# Patient Record
Sex: Female | Born: 1950 | Race: White | Hispanic: No | Smoking: Former smoker
Health system: Southern US, Community
[De-identification: ages and names within clinical notes are randomized; demographics above are authoritative.]

## PROBLEM LIST (undated history)

## (undated) DIAGNOSIS — D649 Anemia, unspecified: Secondary | ICD-10-CM

## (undated) DIAGNOSIS — E785 Hyperlipidemia, unspecified: Secondary | ICD-10-CM

## (undated) DIAGNOSIS — E079 Disorder of thyroid, unspecified: Secondary | ICD-10-CM

## (undated) DIAGNOSIS — R42 Dizziness and giddiness: Secondary | ICD-10-CM

## (undated) DIAGNOSIS — D62 Acute posthemorrhagic anemia: Secondary | ICD-10-CM

## (undated) DIAGNOSIS — Z972 Presence of dental prosthetic device (complete) (partial): Secondary | ICD-10-CM

## (undated) DIAGNOSIS — Z8719 Personal history of other diseases of the digestive system: Secondary | ICD-10-CM

## (undated) DIAGNOSIS — Z8711 Personal history of peptic ulcer disease: Secondary | ICD-10-CM

## (undated) HISTORY — DX: Anemia, unspecified: D64.9

## (undated) HISTORY — DX: Acute posthemorrhagic anemia: D62

## (undated) HISTORY — DX: Disorder of thyroid, unspecified: E07.9

## (undated) HISTORY — DX: Personal history of peptic ulcer disease: Z87.11

## (undated) HISTORY — DX: Hyperlipidemia, unspecified: E78.5

## (undated) HISTORY — DX: Personal history of other diseases of the digestive system: Z87.19

---

## 1981-09-11 HISTORY — PX: STOMACH SURGERY: SHX791

## 1988-09-11 HISTORY — PX: KNEE SURGERY: SHX244

## 2011-09-26 ENCOUNTER — Ambulatory Visit: Payer: Self-pay

## 2012-09-26 ENCOUNTER — Ambulatory Visit: Payer: Self-pay

## 2012-12-16 ENCOUNTER — Ambulatory Visit: Payer: Self-pay | Admitting: Gastroenterology

## 2013-09-30 ENCOUNTER — Ambulatory Visit: Payer: Self-pay

## 2013-10-23 ENCOUNTER — Ambulatory Visit: Payer: Self-pay

## 2013-12-23 ENCOUNTER — Encounter: Payer: Self-pay | Admitting: General Surgery

## 2014-01-15 ENCOUNTER — Ambulatory Visit: Payer: Self-pay | Admitting: General Surgery

## 2014-01-21 ENCOUNTER — Other Ambulatory Visit: Payer: Self-pay | Admitting: General Surgery

## 2014-01-21 ENCOUNTER — Ambulatory Visit (INDEPENDENT_AMBULATORY_CARE_PROVIDER_SITE_OTHER): Payer: 59 | Admitting: General Surgery

## 2014-01-21 ENCOUNTER — Encounter: Payer: Self-pay | Admitting: General Surgery

## 2014-01-21 VITALS — BP 140/80 | HR 68 | Resp 12 | Ht 66.0 in | Wt 163.0 lb

## 2014-01-21 DIAGNOSIS — R221 Localized swelling, mass and lump, neck: Secondary | ICD-10-CM

## 2014-01-21 DIAGNOSIS — D234 Other benign neoplasm of skin of scalp and neck: Secondary | ICD-10-CM

## 2014-01-21 DIAGNOSIS — R220 Localized swelling, mass and lump, head: Secondary | ICD-10-CM

## 2014-01-21 NOTE — Patient Instructions (Signed)
Patient to return in 1 week for nurse visit. The patient is aware to call back for any questions or concerns.  

## 2014-01-21 NOTE — Progress Notes (Signed)
Patient ID: Diane Weiss, female   DOB: September 12, 1950, 63 y.o.   MRN: [id number]  Chief Complaint  Patient presents with  . Mass    back of head    HPI Diane Weiss is a 63 y.o. female.  Here today for evaluation of a knot on the back of her head. The patient states the knot has been there for several years approximately 30 years. She states the area has gotten larger gradually over time. The only time it seems to bother her is when she lays down on that side of her head. No injuries in this area.   HPI  Past Medical History  Diagnosis Date  . Thyroid disease   . Hyperlipidemia   . History of stomach ulcers     Past Surgical History  Procedure Laterality Date  . Stomach surgery  1983    bleeding ulcers  . Knee surgery  1990    Family History  Problem Relation Age of Onset  . Cancer Cousin   . Hypertension Mother   . Hypertension Father   . Diabetes Mother     Social History History  Substance Use Topics  . Smoking status: Former Smoker    Quit date: 09/11/1981  . Smokeless tobacco: Not on file  . Alcohol Use: No    No Known Allergies  Current Outpatient Prescriptions  Medication Sig Dispense Refill  . baclofen (LIORESAL) 10 MG tablet Take 10 mg by mouth daily.      . calcium carbonate (OS-CAL) 600 MG TABS tablet Take 600 mg by mouth 2 (two) times daily with a meal.      . cholecalciferol (VITAMIN D) 1000 UNITS tablet Take 1,000 Units by mouth 2 (two) times daily.      Marland Kitchen ezetimibe (ZETIA) 10 MG tablet Take 10 mg by mouth daily.      Marland Kitchen ibandronate (BONIVA) 150 MG tablet Take 150 mg by mouth every 30 (thirty) days. Take in the morning with a full glass of water, on an empty stomach, and do not take anything else by mouth or lie down for the next 30 min.      Marland Kitchen levothyroxine (SYNTHROID, LEVOTHROID) 75 MCG tablet Take 75 mcg by mouth daily before breakfast.      . lovastatin (ALTOPREV) 40 MG 24 hr tablet Take 40 mg by mouth at bedtime.      . Misc Natural Products  (LUTEIN 20 PO) Take by mouth daily.      . traMADol (ULTRAM) 50 MG tablet Take by mouth every 6 (six) hours as needed.       No current facility-administered medications for this visit.    Review of Systems Review of Systems  Constitutional: Negative.   Respiratory: Negative.   Cardiovascular: Negative.     Blood pressure 140/80, pulse 68, resp. rate 12, height 5\' 6"  (1.676 m), weight 163 lb (73.936 kg).  Physical Exam Physical Exam  Constitutional: She is oriented to person, place, and time. She appears well-developed and well-nourished.  Neck: Neck supple. No thyromegaly present.  Cardiovascular: Normal rate, regular rhythm and normal heart sounds.   No murmur heard. Pulmonary/Chest: Effort normal and breath sounds normal.  Lymphadenopathy:    She has no cervical adenopathy.  Neurological: She is alert and oriented to person, place, and time.  Skin: Skin is warm and dry.  Right parietal scalp there is a 3 x 4 cm soft mobile mass.        Assessment  Enlarging dermal cyst of the scalp.    Plan    It was elected to proceed to excision. 10 cc of 0.5% Xylocaine with 0.25% Marcaine with 1 200,000 units of epinephrine was utilized for local anesthesia well tolerated.  An ellipse of skin was excised to prevent redundant tissue and the cyst was removed. A significant liquid component did rupture during excision, but the entire cyst wall was removed. The wound was closed in layers with 3-0 Vicryl to obliterate the dead space and a running 4-0 nylon suture for the skin. Bacitracin was applied to the skin. The procedure was well tolerated.    The patient return in one week for suture removal. PCP: Dear, Lorayne Bender 01/21/2014, 2:33 PM

## 2014-01-22 DIAGNOSIS — R220 Localized swelling, mass and lump, head: Secondary | ICD-10-CM | POA: Insufficient documentation

## 2014-01-22 DIAGNOSIS — D234 Other benign neoplasm of skin of scalp and neck: Secondary | ICD-10-CM | POA: Insufficient documentation

## 2014-01-23 LAB — PATHOLOGY

## 2014-01-27 ENCOUNTER — Telehealth: Payer: Self-pay | Admitting: *Deleted

## 2014-01-27 NOTE — Telephone Encounter (Signed)
Pt was called to confirm appointment and also was given results

## 2014-01-27 NOTE — Telephone Encounter (Signed)
Message copied by Carson Myrtle on Tue Jan 27, 2014  8:23 AM ------      Message from: [place], [place] W      Created: Mon Jan 26, 2014 11:52 AM       The patient is coming in on Thursday for suture removal. Please notify her the path was benign. Thanks. She should call if the area recurs.       ----- Message -----         From: [facility] Lab Results In Interface         Sent: 01/26/2014  10:05 AM           To: Robert Bellow, MD                   ------

## 2014-01-28 NOTE — Telephone Encounter (Signed)
Notified patient as instructed, patient pleased. Discussed follow-up appointments, patient agrees  

## 2014-01-29 ENCOUNTER — Ambulatory Visit (INDEPENDENT_AMBULATORY_CARE_PROVIDER_SITE_OTHER): Payer: Self-pay | Admitting: *Deleted

## 2014-01-29 DIAGNOSIS — R221 Localized swelling, mass and lump, neck: Secondary | ICD-10-CM

## 2014-01-29 DIAGNOSIS — R220 Localized swelling, mass and lump, head: Secondary | ICD-10-CM

## 2014-01-29 NOTE — Progress Notes (Signed)
Patient came in today for a wound check.  The wound is clean, with no signs of infection noted.The sutures were removed.  

## 2014-07-13 ENCOUNTER — Encounter: Payer: Self-pay | Admitting: General Surgery

## 2015-10-18 ENCOUNTER — Other Ambulatory Visit: Payer: Self-pay | Admitting: Obstetrics and Gynecology

## 2015-10-18 DIAGNOSIS — Z01419 Encounter for gynecological examination (general) (routine) without abnormal findings: Secondary | ICD-10-CM | POA: Diagnosis not present

## 2015-10-18 DIAGNOSIS — Z315 Encounter for genetic counseling: Secondary | ICD-10-CM | POA: Diagnosis not present

## 2015-10-18 DIAGNOSIS — R7309 Other abnormal glucose: Secondary | ICD-10-CM | POA: Diagnosis not present

## 2015-10-18 DIAGNOSIS — E785 Hyperlipidemia, unspecified: Secondary | ICD-10-CM | POA: Diagnosis not present

## 2015-10-18 DIAGNOSIS — E039 Hypothyroidism, unspecified: Secondary | ICD-10-CM | POA: Diagnosis not present

## 2015-10-18 DIAGNOSIS — Z13820 Encounter for screening for osteoporosis: Secondary | ICD-10-CM

## 2015-10-18 DIAGNOSIS — Z1231 Encounter for screening mammogram for malignant neoplasm of breast: Secondary | ICD-10-CM

## 2015-10-18 DIAGNOSIS — Z1239 Encounter for other screening for malignant neoplasm of breast: Secondary | ICD-10-CM | POA: Diagnosis not present

## 2015-10-18 DIAGNOSIS — Z0000 Encounter for general adult medical examination without abnormal findings: Secondary | ICD-10-CM | POA: Diagnosis not present

## 2015-10-18 DIAGNOSIS — Z803 Family history of malignant neoplasm of breast: Secondary | ICD-10-CM | POA: Diagnosis not present

## 2015-10-19 ENCOUNTER — Inpatient Hospital Stay
Admission: RE | Admit: 2015-10-19 | Discharge: 2015-10-19 | Disposition: A | Discharge status: Home / Self Care | Payer: Self-pay | Source: Ambulatory Visit | Attending: *Deleted | Admitting: *Deleted

## 2015-10-19 ENCOUNTER — Other Ambulatory Visit: Payer: Self-pay | Admitting: *Deleted

## 2015-10-19 ENCOUNTER — Other Ambulatory Visit: Payer: Self-pay | Admitting: Obstetrics and Gynecology

## 2015-10-19 DIAGNOSIS — R928 Other abnormal and inconclusive findings on diagnostic imaging of breast: Secondary | ICD-10-CM

## 2015-10-19 DIAGNOSIS — Z9289 Personal history of other medical treatment: Secondary | ICD-10-CM

## 2015-10-19 DIAGNOSIS — Z803 Family history of malignant neoplasm of breast: Secondary | ICD-10-CM | POA: Diagnosis not present

## 2015-10-26 DIAGNOSIS — G894 Chronic pain syndrome: Secondary | ICD-10-CM | POA: Diagnosis not present

## 2015-10-26 DIAGNOSIS — M791 Myalgia: Secondary | ICD-10-CM | POA: Diagnosis not present

## 2015-10-28 ENCOUNTER — Ambulatory Visit
Admission: RE | Admit: 2015-10-28 | Discharge: 2015-10-28 | Disposition: A | Discharge status: Home / Self Care | Payer: PPO | Source: Ambulatory Visit | Attending: Obstetrics and Gynecology | Admitting: Obstetrics and Gynecology

## 2015-10-28 DIAGNOSIS — Z780 Asymptomatic menopausal state: Secondary | ICD-10-CM | POA: Diagnosis not present

## 2015-10-28 DIAGNOSIS — M810 Age-related osteoporosis without current pathological fracture: Secondary | ICD-10-CM | POA: Diagnosis not present

## 2015-10-28 DIAGNOSIS — Z13820 Encounter for screening for osteoporosis: Secondary | ICD-10-CM | POA: Diagnosis not present

## 2015-11-02 ENCOUNTER — Other Ambulatory Visit: Payer: Self-pay | Admitting: Obstetrics and Gynecology

## 2015-11-02 ENCOUNTER — Ambulatory Visit
Admission: RE | Admit: 2015-11-02 | Discharge: 2015-11-02 | Disposition: A | Discharge status: Home / Self Care | Payer: PPO | Source: Ambulatory Visit | Attending: Obstetrics and Gynecology | Admitting: Obstetrics and Gynecology

## 2015-11-02 DIAGNOSIS — R928 Other abnormal and inconclusive findings on diagnostic imaging of breast: Secondary | ICD-10-CM | POA: Diagnosis not present

## 2015-11-02 DIAGNOSIS — N6489 Other specified disorders of breast: Secondary | ICD-10-CM | POA: Diagnosis not present

## 2015-11-16 DIAGNOSIS — R7309 Other abnormal glucose: Secondary | ICD-10-CM | POA: Diagnosis not present

## 2015-11-16 DIAGNOSIS — E039 Hypothyroidism, unspecified: Secondary | ICD-10-CM | POA: Diagnosis not present

## 2015-11-16 DIAGNOSIS — E875 Hyperkalemia: Secondary | ICD-10-CM | POA: Diagnosis not present

## 2015-11-16 DIAGNOSIS — E785 Hyperlipidemia, unspecified: Secondary | ICD-10-CM | POA: Diagnosis not present

## 2016-01-24 DIAGNOSIS — M791 Myalgia: Secondary | ICD-10-CM | POA: Diagnosis not present

## 2016-01-24 DIAGNOSIS — Z79891 Long term (current) use of opiate analgesic: Secondary | ICD-10-CM | POA: Diagnosis not present

## 2016-01-24 DIAGNOSIS — Z79899 Other long term (current) drug therapy: Secondary | ICD-10-CM | POA: Diagnosis not present

## 2016-01-24 DIAGNOSIS — G894 Chronic pain syndrome: Secondary | ICD-10-CM | POA: Diagnosis not present

## 2016-02-25 DIAGNOSIS — G894 Chronic pain syndrome: Secondary | ICD-10-CM | POA: Diagnosis not present

## 2016-02-25 DIAGNOSIS — M791 Myalgia: Secondary | ICD-10-CM | POA: Diagnosis not present

## 2016-03-25 ENCOUNTER — Emergency Department (HOSPITAL_COMMUNITY): Payer: PPO

## 2016-03-25 ENCOUNTER — Emergency Department (HOSPITAL_COMMUNITY)
Admission: EM | Admit: 2016-03-25 | Discharge: 2016-03-25 | Disposition: A | Discharge status: Home / Self Care | Payer: PPO | Attending: Emergency Medicine | Admitting: Emergency Medicine

## 2016-03-25 ENCOUNTER — Encounter (HOSPITAL_COMMUNITY): Payer: Self-pay

## 2016-03-25 DIAGNOSIS — S52591A Other fractures of lower end of right radius, initial encounter for closed fracture: Secondary | ICD-10-CM | POA: Insufficient documentation

## 2016-03-25 DIAGNOSIS — Y9367 Activity, basketball: Secondary | ICD-10-CM | POA: Diagnosis not present

## 2016-03-25 DIAGNOSIS — Y999 Unspecified external cause status: Secondary | ICD-10-CM | POA: Diagnosis not present

## 2016-03-25 DIAGNOSIS — E785 Hyperlipidemia, unspecified: Secondary | ICD-10-CM | POA: Insufficient documentation

## 2016-03-25 DIAGNOSIS — S52501A Unspecified fracture of the lower end of right radius, initial encounter for closed fracture: Secondary | ICD-10-CM

## 2016-03-25 DIAGNOSIS — Z87891 Personal history of nicotine dependence: Secondary | ICD-10-CM | POA: Insufficient documentation

## 2016-03-25 DIAGNOSIS — S6991XA Unspecified injury of right wrist, hand and finger(s), initial encounter: Secondary | ICD-10-CM | POA: Diagnosis not present

## 2016-03-25 DIAGNOSIS — Y929 Unspecified place or not applicable: Secondary | ICD-10-CM | POA: Insufficient documentation

## 2016-03-25 DIAGNOSIS — S5291XA Unspecified fracture of right forearm, initial encounter for closed fracture: Secondary | ICD-10-CM | POA: Diagnosis not present

## 2016-03-25 DIAGNOSIS — W010XXA Fall on same level from slipping, tripping and stumbling without subsequent striking against object, initial encounter: Secondary | ICD-10-CM | POA: Diagnosis not present

## 2016-03-25 MED ORDER — OXYCODONE-ACETAMINOPHEN 5-325 MG PO TABS: 1.0000 | ORAL_TABLET | ORAL | Status: DC | PRN

## 2016-03-25 MED ORDER — NAPROXEN 500 MG PO TABS: 500.0000 mg | ORAL_TABLET | Freq: Two times a day (BID) | ORAL | Status: DC

## 2016-03-25 MED ORDER — OXYCODONE-ACETAMINOPHEN 5-325 MG PO TABS
1.0000 | ORAL_TABLET | Freq: Once | ORAL | Status: AC
  Administered 2016-03-25: 1 via ORAL
  Filled 2016-03-25: qty 1

## 2016-03-25 NOTE — Discharge Instructions (Signed)
Radial Fracture  A radial fracture is a break in the radius bone, which is the long bone of the forearm that is on the same side as your thumb. Your forearm is the part of your arm that is between your elbow and your wrist. It is made up of two bones: the radius and the ulna.  Most radial fractures occur near the wrist (distal radialfracture) or near the elbow (radial head fracture). A distal radial fracture is the most common type of broken arm. This fracture usually occurs about an inch above the wrist. Fractures of the middle part of the bone are less common.  CAUSES   Falling with your arm outstretched is the most common cause of a radial fracture. Other causes include:   Car accidents.   Bike accidents.   A direct blow to the middle part of the radius.  RISK FACTORS   You may be at greater risk for a distal radial fracture if you are 60 years of age or older.   You may be at greater risk for a radial head fracture if you are:    Female.    30-40 years old.   You may be at a greater risk for all types of radial fractures if you have a condition that causes your bones to be weak or thin (osteoporosis).  SIGNS AND SYMPTOMS  A radial fracture causes pain immediately after the injury. Other signs and symptoms include:   An abnormal bend or bump in your arm (deformity).   Swelling.   Bruising.   Numbness or tingling.   Tenderness.   Limited movement.  DIAGNOSIS   Your health care provider may diagnose a radial fracture based on:   Your symptoms.   Your medical history, including any recent injury.   A physical exam. Your health care provider will look for any deformity and feel for tenderness over the break. Your health care provider will also check whether the bone is out of place.   An X-ray exam to confirm the diagnosis and learn more about the type of fracture.  TREATMENT  The goals of treatment are to get the bone in proper position for healing and to keep it from moving so it will heal over  time. Your treatment will depend on many factors, especially the type of fracture that you have.   If the fractured bone:    Is in the correct position (nondisplaced), you may only need to wear a cast or a splint.    Has a slightly displaced fracture, you may need to have the bones moved back into place manually (closed reduction) before the splint or cast is put on.   You may have a temporary splint before you have a plaster cast. The splint allows room for some swelling. After a few days, a cast can replace the splint.    You may have to wear the cast for about 6 weeks or as directed by your health care provider.    The cast may be changed after about 3 weeks or as directed by your health care provider.   After your cast is taken off, you may need physical therapy to regain full movement in your wrist or elbow.   You may need emergency surgery if you have:    A fractured bone that is out of position (displaced).    A fracture with multiple fragments (comminuted fracture).    A fracture that breaks the skin (open fracture). This type of   fracture may require surgical wires, plates, or screws to hold the bone in place.   You may have X-rays every couple of weeks to check on your healing.  HOME CARE INSTRUCTIONS   Keep the injured arm above the level of your heart while you are sitting or lying down. This helps to reduce swelling and pain.   Apply ice to the injured area:    Put ice in a plastic bag.    Place a towel between your skin and the bag.    Leave the ice on for 20 minutes, 2-3 times per day.   Move your fingers often to avoid stiffness and to minimize swelling.   If you have a plaster or fiberglass cast:    Do not try to scratch the skin under the cast using sharp or pointed objects.    Check the skin around the cast every day. You may put lotion on any red or sore areas.    Keep your cast dry and clean.   If you have a plaster splint:    Wear the splint as directed.    Loosen the elastic around  the splint if your fingers become numb and tingle, or if they turn cold and blue.   Do not put pressure on any part of your cast until it is fully hardened. Rest your cast only on a pillow for the first 24 hours.   Protect your cast or splint while bathing or showering, as directed by your health care provider. Do not put your cast or splint into water.   Take medicines only as directed by your health care provider.   Return to activities, such as sports, as directed by your health care provider. Ask your health care provider what activities are safe for you.   Keep all follow-up visits as directed by your health care provider. This is important.  SEEK MEDICAL CARE IF:   Your pain medicine is not helping.   Your cast gets damaged or it breaks.   Your cast becomes loose.   Your cast gets wet.   You have more severe pain or swelling than you did before the cast.   You have severe pain when stretching your fingers.   You continue to have pain or stiffness in your elbow or your wrist after your cast is taken off.  SEEK IMMEDIATE MEDICAL CARE IF:   You cannot move your fingers.   You lose feeling in your fingers or your hand.   Your hand or your fingers turn cold and pale or blue.   You notice a bad smell coming from your cast.   You have drainage from underneath your cast.   You have new stains from blood or drainage seeping through your cast.     This information is not intended to replace advice given to you by your health care provider. Make sure you discuss any questions you have with your health care provider.     Document Released: 02/08/2006 Document Revised: 09/18/2014 Document Reviewed: 02/20/2014  Elsevier Interactive Patient Education 2016 [employer]

## 2016-03-25 NOTE — ED Provider Notes (Signed)
CSN: [id number]     Arrival date & time 03/25/16  2105 History   First MD Initiated Contact with Patient 03/25/16 2123     Chief Complaint  Patient presents with  . Fall     (Consider location/radiation/quality/duration/timing/severity/associated sxs/prior Treatment) HPI  Diane Weiss is a 65 y.o. female who presents to the Emergency Department complaining of Sudden onset of pain to the right wrist. She states that she was playing basketball and fell onto her right hand and wrist. Injury occurred approximately 2 and half hours ago. Patient took 2 Aleve prior to ER arrival and has applied ice. She complains of pain with attempted movement and swelling to the wrist associated with numbness and tingling to her fingers. She denies other injuries, open wounds, pain to the elbow, or head injury.    Past Medical History  Diagnosis Date  . Thyroid disease   . Hyperlipidemia   . History of stomach ulcers    Past Surgical History  Procedure Laterality Date  . Stomach surgery  1983    bleeding ulcers  . Knee surgery  1990   Family History  Problem Relation Age of Onset  . Cancer Cousin   . Hypertension Mother   . Diabetes Mother   . Hypertension Father   . Breast cancer Sister 72  . Breast cancer Maternal Aunt 75  . Breast cancer Paternal Aunt 40   Social History  Substance Use Topics  . Smoking status: Former Smoker    Quit date: 09/11/1981  . Smokeless tobacco: None  . Alcohol Use: No   OB History    Gravida Para Term Preterm AB TAB SAB Ectopic Multiple Living   2 2        1       Obstetric Comments   1st Menstrual Cycle:  12  1st Pregnancy:  67 Menopause age 64     Review of Systems  Constitutional: Negative for fever and chills.  Musculoskeletal: Positive for joint swelling and arthralgias (right wrist pain and swelling).  Skin: Negative for color change and wound.  Neurological: Negative for weakness and numbness.  All other systems reviewed and are  negative.     Allergies  Review of patient's allergies indicates no known allergies.  Home Medications   Prior to Admission medications   Medication Sig Start Date End Date Taking? Authorizing Provider  baclofen (LIORESAL) 10 MG tablet Take 10 mg by mouth daily.    Historical Provider, MD  calcium carbonate (OS-CAL) 600 MG TABS tablet Take 600 mg by mouth 2 (two) times daily with a meal.    Historical Provider, MD  cholecalciferol (VITAMIN D) 1000 UNITS tablet Take 1,000 Units by mouth 2 (two) times daily.    Historical Provider, MD  ezetimibe (ZETIA) 10 MG tablet Take 10 mg by mouth daily.    Historical Provider, MD  ibandronate (BONIVA) 150 MG tablet Take 150 mg by mouth every 30 (thirty) days. Take in the morning with a full glass of water, on an empty stomach, and do not take anything else by mouth or lie down for the next 30 min.    Historical Provider, MD  levothyroxine (SYNTHROID, LEVOTHROID) 75 MCG tablet Take 75 mcg by mouth daily before breakfast.    Historical Provider, MD  lovastatin (ALTOPREV) 40 MG 24 hr tablet Take 40 mg by mouth at bedtime.    Historical Provider, MD  Misc Natural Products (LUTEIN 20 PO) Take by mouth daily.    Historical Provider, MD  traMADol (ULTRAM) 50 MG tablet Take by mouth every 6 (six) hours as needed.    Historical Provider, MD   BP 172/68 mmHg  Pulse 84  Temp(Src) 99.5 F (37.5 C) (Oral)  Resp 16  Ht 5' 5.5" (1.664 m)  Wt 70.308 kg  BMI 25.39 kg/m2  SpO2 98% Physical Exam  Constitutional: She is oriented to person, place, and time. She appears well-developed and well-nourished. No distress.  HENT:  Head: Normocephalic and atraumatic.  Neck: Normal range of motion.  Cardiovascular: Normal rate, regular rhythm and intact distal pulses.   Pulmonary/Chest: Effort normal and breath sounds normal. No respiratory distress.  Musculoskeletal: She exhibits edema and tenderness.   ttp , edema of the distal right wrist,  Bony deformity noted.  Radial pulse is brisk, distal sensation intact.  CR< 2 sec.  No tenderness proximal to the wrist. Compartments soft  Neurological: She is alert and oriented to person, place, and time. She exhibits normal muscle tone. Coordination normal.  Skin: Skin is warm and dry.  Nursing note and vitals reviewed.   ED Course  Procedures (including critical care time) Labs Review Labs Reviewed - No data to display  Imaging Review Dg Wrist Complete Right  03/25/2016  CLINICAL DATA:  FALL, INJURY, RIGHT HAND/WRIST PAIN, PATIENT STATES " SHE WAS PLAYING BASKETBALL THIS EVENING AND TRIPPED IN FLIP FLOPS AND FELL ONTO RIGHT HAND/WRIST " HISTORY OF THYROID DISEASE, HYPERLIPIDEMIA EXAM: RIGHT WRIST - COMPLETE 3+ VIEW COMPARISON:  None. FINDINGS: There is a minimally displaced fracture involving the distal aspect of the radius and its articular surface. Intercarpal spaces appear normal. Distal ulna appears intact. IMPRESSION: Fracture the distal radius. Electronically Signed   By: Nolon Nations M.D.   On: 03/25/2016 22:00   Dg Hand Complete Right  03/25/2016  CLINICAL DATA:  FALL, INJURY, RIGHT HAND/WRIST PAIN, PATIENT STATES " SHE WAS PLAYING BASKETBALL THIS EVENING AND TRIPPED IN FLIP FLOPS AND FELL ONTO RIGHT HAND/WRIST " HISTORY OF THYROID DISEASE, HYPERLIPIDEMIA EXAM: RIGHT HAND - COMPLETE 3+ VIEW COMPARISON:  None. FINDINGS: Comminuted minimally displaced fracture the distal radius, better seen on views of the wrist on the same day. No other acute fracture or subluxation. IMPRESSION: Fracture the distal radius. Electronically Signed   By: Nolon Nations M.D.   On: 03/25/2016 22:01   I have personally reviewed and evaluated these images and lab results as part of my medical decision-making.   EKG Interpretation None      MDM   Final diagnoses:  Distal radius fracture, right, closed, initial encounter    Pt also seen by Dr. Lacinda Axon and care plan discussed.  XR results discussed with pt. Sugar tong  splint and sling applied.  NV intact.  Pt prefers to follow-up with orthopedics in [place].  Appears stable for d/c   Kem Parkinson, PA-C 03/27/16 [place], MD 03/28/16 [phone]

## 2016-03-25 NOTE — ED Notes (Signed)
Patient verbalizes understanding of discharge instructions, prescriptions, home care and follow up care. Patient out of department at this time. 

## 2016-03-25 NOTE — ED Notes (Signed)
[place] and hurt my right wrist around 7 pm.  Was playing basketball and fell.

## 2016-03-27 MED FILL — Oxycodone w/ Acetaminophen Tab 5-325 MG: ORAL | Qty: 6 | Status: AC

## 2016-03-29 DIAGNOSIS — M25531 Pain in right wrist: Secondary | ICD-10-CM | POA: Diagnosis not present

## 2016-03-29 DIAGNOSIS — S52501A Unspecified fracture of the lower end of right radius, initial encounter for closed fracture: Secondary | ICD-10-CM | POA: Diagnosis not present

## 2016-04-12 DIAGNOSIS — S52501A Unspecified fracture of the lower end of right radius, initial encounter for closed fracture: Secondary | ICD-10-CM | POA: Diagnosis not present

## 2016-04-12 DIAGNOSIS — M25531 Pain in right wrist: Secondary | ICD-10-CM | POA: Diagnosis not present

## 2016-04-24 DIAGNOSIS — M791 Myalgia: Secondary | ICD-10-CM | POA: Diagnosis not present

## 2016-04-24 DIAGNOSIS — G894 Chronic pain syndrome: Secondary | ICD-10-CM | POA: Diagnosis not present

## 2016-04-24 DIAGNOSIS — Z6825 Body mass index (BMI) 25.0-25.9, adult: Secondary | ICD-10-CM | POA: Diagnosis not present

## 2016-04-26 DIAGNOSIS — G90511 Complex regional pain syndrome I of right upper limb: Secondary | ICD-10-CM | POA: Diagnosis not present

## 2016-04-26 DIAGNOSIS — S52501D Unspecified fracture of the lower end of right radius, subsequent encounter for closed fracture with routine healing: Secondary | ICD-10-CM | POA: Diagnosis not present

## 2016-04-26 DIAGNOSIS — M25531 Pain in right wrist: Secondary | ICD-10-CM | POA: Diagnosis not present

## 2016-05-03 ENCOUNTER — Ambulatory Visit: Payer: PPO | Attending: Orthopedic Surgery | Admitting: Occupational Therapy

## 2016-05-03 DIAGNOSIS — M79641 Pain in right hand: Secondary | ICD-10-CM | POA: Insufficient documentation

## 2016-05-03 DIAGNOSIS — R7309 Other abnormal glucose: Secondary | ICD-10-CM | POA: Diagnosis not present

## 2016-05-03 DIAGNOSIS — M25531 Pain in right wrist: Secondary | ICD-10-CM | POA: Insufficient documentation

## 2016-05-03 DIAGNOSIS — E039 Hypothyroidism, unspecified: Secondary | ICD-10-CM | POA: Diagnosis not present

## 2016-05-03 DIAGNOSIS — M25641 Stiffness of right hand, not elsewhere classified: Secondary | ICD-10-CM | POA: Insufficient documentation

## 2016-05-03 DIAGNOSIS — M6281 Muscle weakness (generalized): Secondary | ICD-10-CM | POA: Diagnosis not present

## 2016-05-03 DIAGNOSIS — M25631 Stiffness of right wrist, not elsewhere classified: Secondary | ICD-10-CM | POA: Diagnosis not present

## 2016-05-03 DIAGNOSIS — E875 Hyperkalemia: Secondary | ICD-10-CM | POA: Diagnosis not present

## 2016-05-03 DIAGNOSIS — E785 Hyperlipidemia, unspecified: Secondary | ICD-10-CM | POA: Diagnosis not present

## 2016-05-03 NOTE — Therapy (Signed)
[facility] [address] [place], [address] Phone: [phone]   Fax:  [phone]  Occupational Therapy Treatment  Patient Details  Name: Diane Weiss MRN: [id number] Date of Birth: 13-Mar-1951 Referring Provider: Prescott Parma  Encounter Date: 05/03/2016      OT End of Session - 05/03/16 0958    Visit Number 1   Number of Visits 12   Date for OT Re-Evaluation 06/14/16   OT Start Time 0905   OT Stop Time 0955   OT Time Calculation (min) 50 min   Activity Tolerance Patient tolerated treatment well   Behavior During Therapy [facility] for tasks assessed/performed      Past Medical History:  Diagnosis Date  . History of stomach ulcers   . Hyperlipidemia   . Thyroid disease     Past Surgical History:  Procedure Laterality Date  . KNEE SURGERY  1990  . STOMACH SURGERY  1983   bleeding ulcers    There were no vitals filed for this visit.      Subjective Assessment - 05/03/16 0928    Subjective  I fell playing basketball with my son and grandson - in cast 2 wks and then splint for another 3 wks until now - refer to therapy - see MD in 2 wks    Patient Stated Goals I want to use my hand again - write better , cutting food, open jars, dress, fix hair , do water aerobics , and back to work    Currently in Pain? No/denies            [facility] OT Assessment - 05/03/16 0001      Assessment   Diagnosis R distal radius fx   Referring Provider Prescott Parma   Onset Date 03/25/16     Precautions   Required Braces or Orthoses --  wrist splint     Balance Screen   Has the patient fallen in the past 6 months Yes   How many times? 1   Has the patient had a decrease in activity level because of a fear of falling?  No   Is the patient reluctant to leave their home because of a fear of falling?  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 - play with grandkids, on phone games, read, visit with friends and famly and  water aerobics at [facility]     AROM   Right Forearm Pronation 85 Degrees   Right Forearm Supination 85 Degrees   Right Wrist Extension 48 Degrees   Right Wrist Flexion 32 Degrees   Right Wrist Radial Deviation 12 Degrees   Right Wrist Ulnar Deviation 14 Degrees   Left Wrist Extension 77 Degrees   Left Wrist Flexion 83 Degrees   Left Wrist Radial Deviation 16 Degrees   Left Wrist Ulnar Deviation 35 Degrees     Right Hand AROM   R Thumb Opposition to Index --  TO base of 5th but increase pain at 3rd and base of 5th    R Index  MCP 0-90 65 Degrees   R Index PIP 0-100 80 Degrees   R Long  MCP 0-90 75 Degrees   R Long PIP 0-100 70 Degrees   R Ring  MCP 0-90 80 Degrees   R Ring PIP  0-100 75 Degrees   R Little  MCP 0-90 80 Degrees   R Little PIP 0-100 80 Degrees      Contrast at home  PROM gentle for flexion and extnetion of wrist  AROM flexion and extnetion  AROM RD AND UD  Sup/pro AROM   Tendon glides Thumb PA and RA  Opposition to all digits                       OT Education - 05/03/16 0958    Education provided Yes   Education Details findings of eval and HEP   Person(s) Educated Patient   Methods Explanation;Tactile cues;Verbal cues;Handout;Demonstration   Comprehension Verbalized understanding;Returned demonstration;Verbal cues required;Tactile cues required          OT Short Term Goals - 05/03/16 1207      OT SHORT TERM GOAL #1   Title Fisting in R hand improve for pt to touch palm to hold 1" objects during ADL's    Baseline MC's 65-80 and PIP 's 70-80's - pain in 3rd    Time 3   Period Weeks   Status New     OT SHORT TERM GOAL #2   Title Wrist AROM improve with 10-15 degrees in flexion, ext and UD to turn doorknob , push up from chair, sweep   Baseline R wrist ext 48, flexion 32, UD 14    Time 4   Period Weeks   Status New     OT  SHORT TERM GOAL #3   Title Opposition improve to WNL and no pain for pt to do buttons    Baseline Pain opposition to 3rd and pain at thumb- unable to do buttons with R    Time 2   Period Weeks   Status New           OT Long Term Goals - 05/03/16 1210      OT LONG TERM GOAL #1   Title Function score on PRWHE improve with at least 15 points   Baseline At eval PRWHE fucntion score 27.5/50   Time 5   Period Weeks   Status New     OT LONG TERM GOAL #2   Title Grip strength in R hand improve to at least 50% compare to L hand to carry groceries     Baseline NT yet 5 wks out from fx   Time 6   Period Weeks   Status New     OT LONG TERM GOAL #3   Title R wrist strength improve to be able to cut food, turn doorknob, push up from chair, and water aerobics   Baseline 5 wks out from fx - in splint still    Time 6   Period Weeks   Status New               Plan - 05/03/16 0959    Clinical Impression Statement Pt present at eval 5 wks out from fall resulting in distal radius fx - pt was casted for 2 wks and then in splint - still wearing it and to see MD in 2 wks - pt report numbness in 3rd and at times in 4th - pt do admit she had some numbness issues prior to fracture in R hand when driving or talking on phone -  pt show decrease ROM in wrist and digits in all planes , decrease strength and increase pain - limiting her functional use of R domimant hand  in ADL's and IADL's    Rehab Potential Good   OT Frequency 2x / week   OT Duration 6 weeks   OT Treatment/Interventions Self-care/ADL training;Fluidtherapy;Splinting;Patient/family education;Therapeutic exercises;Contrast Bath;Passive range of motion;Manual Therapy   Plan assess how doing with HEP and progress    OT Home Exercise Plan see pt instruction    Consulted and Agree with Plan of Care Patient      Patient will benefit from skilled therapeutic intervention in order to improve the following deficits and impairments:   Decreased coordination, Decreased range of motion, Impaired flexibility, Increased edema, Impaired sensation, Decreased knowledge of precautions, Impaired UE functional use, Pain, Decreased strength  Visit Diagnosis: Pain in right wrist  Pain in right hand  Stiffness of right wrist, not elsewhere classified  Stiffness of right hand, not elsewhere classified  Muscle weakness (generalized)    Problem List Patient Active Problem List   Diagnosis Date Noted  . Head mass 01/22/2014  . Dermoid cyst of scalp 01/22/2014    Rosalyn Gess OTR/L,CLT  05/03/2016, 12:21 PM  [facility] [address], [place], [address] Phone: [phone]   Fax:  [phone]  Name: Diane Weiss MRN: [id number] Date of Birth: 1950/11/28

## 2016-05-03 NOTE — Patient Instructions (Signed)
Contrast at home  PROM gentle for flexion and extnetion of wrist  AROM flexion and extnetion  AROM RD AND UD  Sup/pro AROM   Tendon glides Thumb PA and RA  Opposition to all digits

## 2016-05-08 ENCOUNTER — Ambulatory Visit: Payer: PPO | Admitting: Occupational Therapy

## 2016-05-08 DIAGNOSIS — M25641 Stiffness of right hand, not elsewhere classified: Secondary | ICD-10-CM

## 2016-05-08 DIAGNOSIS — M25631 Stiffness of right wrist, not elsewhere classified: Secondary | ICD-10-CM

## 2016-05-08 DIAGNOSIS — M79641 Pain in right hand: Secondary | ICD-10-CM

## 2016-05-08 DIAGNOSIS — M25531 Pain in right wrist: Secondary | ICD-10-CM | POA: Diagnosis not present

## 2016-05-08 DIAGNOSIS — M6281 Muscle weakness (generalized): Secondary | ICD-10-CM

## 2016-05-08 NOTE — Therapy (Signed)
[place] [facility] [address], [place], [address] Phone: [phone]   Fax:  [phone]  Occupational Therapy Treatment  Patient Details  Name: Diane Weiss MRN: [id number] Date of Birth: Feb 13, 1951 Referring Provider: Prescott Parma  Encounter Date: 05/08/2016      OT End of Session - 05/08/16 1008    Visit Number 2   Number of Visits 12   Date for OT Re-Evaluation 06/14/16   OT Start Time 0947   OT Stop Time 1044   OT Time Calculation (min) 57 min   Activity Tolerance Patient tolerated treatment well   Behavior During Therapy [facility] for tasks assessed/performed      Past Medical History:  Diagnosis Date  . History of stomach ulcers   . Hyperlipidemia   . Thyroid disease     Past Surgical History:  Procedure Laterality Date  . KNEE SURGERY  1990  . STOMACH SURGERY  1983   bleeding ulcers    There were no vitals filed for this visit.      Subjective Assessment - 05/08/16 0953    Subjective  Exercises doing okay - can do better fist - in the am stiff - but then gets better - top of wrist hurting with bending back exerices    Patient Stated Goals I want to use my hand again - write better , cutting food, open jars, dress, fix hair , do water aerobics , and back to work    Currently in Pain? No/denies            [facility] OT Assessment - 05/08/16 0001      AROM   Right Wrist Extension 53 Degrees   Right Wrist Flexion 43 Degrees   Right Wrist Ulnar Deviation 20 Degrees     Right Hand AROM   R Index  MCP 0-90 75 Degrees   R Index PIP 0-100 95 Degrees   R Long  MCP 0-90 80 Degrees   R Long PIP 0-100 85 Degrees   R Ring  MCP 0-90 85 Degrees   R Ring PIP 0-100 95 Degrees   R Little  MCP 0-90 90 Degrees   R Little PIP 0-100 95 Degrees                  OT Treatments/Exercises (OP) - 05/08/16 0001      RUE Fluidotherapy   Number Minutes Fluidotherapy 10 Minutes   RUE Fluidotherapy  Location Hand;Wrist   Comments AROM at [facility] to increase ROM at wirst and digits - and decrease pain  at [place] Measurements taken for ROM - good progress - see flowsheet  Graston tools for soft tissue mobs to palm to increase digits flexion   and  Over CT  As well as some sweeping , brushing and scooping over palmar and dorsal forearm - prior to ROM - pt did report decrease pain over dorsal wrist at end of session - showed increase ROM   PROM for wrist at edge of table for wrist flexion, ext, RD and UD  10 reps  Mod v/c for not forcing - achor only - not weight bearing thru palm  BTE for CPM wrist extention 200 sec   1 lbs weight for  RD AND UD  Sup/pro  10 reps   pain at dorsal wrist - but when stabilizing with L hand at wrist - had less pain - and needed min  A and mod v/c   Pt to use 16 hammer holding close to head - reviewed and can do   Tendon glides AROM  Thumb PA and RA  Opposition to all digits            OT Education - 05/08/16 1008    Education provided Yes   Education Details HEP update   Person(s) Educated Patient   Methods Demonstration;Tactile cues;Verbal cues;Explanation   Comprehension Verbalized understanding;Returned demonstration;Verbal cues required          OT Short Term Goals - 05/03/16 1207      OT SHORT TERM GOAL #1   Title Fisting in R hand improve for pt to touch palm to hold 1" objects during ADL's    Baseline MC's 65-80 and PIP 's 70-80's - pain in 3rd    Time 3   Period Weeks   Status New     OT SHORT TERM GOAL #2   Title Wrist AROM improve with 10-15 degrees in flexion, ext and UD to turn doorknob , push up from chair, sweep   Baseline R wrist ext 48, flexion 32, UD 14    Time 4   Period Weeks   Status New     OT SHORT TERM GOAL #3   Title Opposition improve to WNL and no pain for pt to do buttons    Baseline Pain opposition to 3rd and pain at thumb- unable to do buttons with R    Time 2   Period Weeks    Status New           OT Long Term Goals - 05/03/16 1210      OT LONG TERM GOAL #1   Title Function score on PRWHE improve with at least 15 points   Baseline At eval PRWHE fucntion score 27.5/50   Time 5   Period Weeks   Status New     OT LONG TERM GOAL #2   Title Grip strength in R hand improve to at least 50% compare to L hand to carry groceries     Baseline NT yet 5 wks out from fx   Time 6   Period Weeks   Status New     OT LONG TERM GOAL #3   Title R wrist strength improve to be able to cut food, turn doorknob, push up from chair, and water aerobics   Baseline 5 wks out from fx - in splint still    Time 6   Period Weeks   Status New               Plan - 05/08/16 1008    Clinical Impression Statement Pt 6 wks this week - added some increase PROM and 1 lbs weight for wrist - showed great progress since last time - but pt was forcing wrist extention and causing some pain over dorsal wrist - change HEP -  cont to increase ROM and  strength gradually    Rehab Potential Good   OT Frequency 2x / week   OT Duration 4 weeks   OT Treatment/Interventions Self-care/ADL training;Fluidtherapy;Splinting;Patient/family education;Therapeutic exercises;Contrast Bath;Passive range of motion;Manual Therapy   Plan assess how doing with weight and PROM HEP upgrade   OT Home Exercise Plan see pt instruction    Consulted and Agree with Plan of Care Patient      Patient will benefit from skilled therapeutic intervention in order to improve the following deficits and impairments:  Decreased coordination, Decreased range  of motion, Impaired flexibility, Increased edema, Impaired sensation, Decreased knowledge of precautions, Impaired UE functional use, Pain, Decreased strength  Visit Diagnosis: Pain in right wrist  Pain in right hand  Stiffness of right hand, not elsewhere classified  Muscle weakness (generalized)  Stiffness of right wrist, not elsewhere  classified    Problem List Patient Active Problem List   Diagnosis Date Noted  . Head mass 01/22/2014  . Dermoid cyst of scalp 01/22/2014    Rosalyn Gess OTR/L,CLT 05/08/2016, 12:56 PM  [place] [facility] [address] [place], [address] Phone: [phone]   Fax:  [phone]  Name: MCKYNZEE ROHER MRN: [id number] Date of Birth: [birth year]

## 2016-05-08 NOTE — Patient Instructions (Addendum)
Contrast  PROM for wrist at edge of table for wrist flexion, ext, RD and UD  10 reps  Mod v/c for not forcing - achor only - not weight bearing thru palm   16 oz hammer  for  RD AND UD  Sup/pro  10 reps   pain at dorsal wrist - but when stabilizing with L hand at wrist - had less pain - and needed min A and mod v/c   AROM for wrist ext, flexion   Tendon glides AROM  Thumb PA and RA  Opposition to all digits

## 2016-05-11 ENCOUNTER — Ambulatory Visit: Payer: PPO | Admitting: Occupational Therapy

## 2016-05-11 DIAGNOSIS — M6281 Muscle weakness (generalized): Secondary | ICD-10-CM

## 2016-05-11 DIAGNOSIS — M25531 Pain in right wrist: Secondary | ICD-10-CM | POA: Diagnosis not present

## 2016-05-11 DIAGNOSIS — M79641 Pain in right hand: Secondary | ICD-10-CM

## 2016-05-11 DIAGNOSIS — M25641 Stiffness of right hand, not elsewhere classified: Secondary | ICD-10-CM

## 2016-05-11 DIAGNOSIS — M25631 Stiffness of right wrist, not elsewhere classified: Secondary | ICD-10-CM

## 2016-05-11 NOTE — Therapy (Signed)
[place] [facility] [address], [place], [address] Phone: [phone]   Fax:  [phone]  Occupational Therapy Treatment  Patient Details  Name: Diane Weiss MRN: [id number] Date of Birth: 01-09-1951 Referring Provider: Prescott Parma  Encounter Date: 05/11/2016      OT End of Session - 05/11/16 1029    Visit Number 3   Number of Visits 12   Date for OT Re-Evaluation 06/14/16   OT Start Time 0958   OT Stop Time 1044   OT Time Calculation (min) 46 min   Activity Tolerance Patient tolerated treatment well   Behavior During Therapy [facility] for tasks assessed/performed      Past Medical History:  Diagnosis Date  . History of stomach ulcers   . Hyperlipidemia   . Thyroid disease     Past Surgical History:  Procedure Laterality Date  . KNEE SURGERY  1990  . STOMACH SURGERY  1983   bleeding ulcers    There were no vitals filed for this visit.      Subjective Assessment - 05/11/16 0957    Subjective  Could not get fork to mouth yey, some itching under soft splint -can write a little better but not for long - my hammer was to small    Patient Stated Goals I want to use my hand again - write better , cutting food, open jars, dress, fix hair , do water aerobics , and back to work    Currently in Pain? Yes                      OT Treatments/Exercises (OP) - 05/11/16 0001      RUE Fluidotherapy   Number Minutes Fluidotherapy 10 Minutes   RUE Fluidotherapy Location Hand;Wrist   Comments at [facility] to increase ROM - and decrease pain at R wirst       after fluido   Graston tools for soft tissue mobs to volar forearm wrist, CT  to palm - and volar digits - focus on 3rd  Prior to ROM - tool 2 and 4 and did sweeping , brushing   showed increase fisting and wrist extention   PROM for wrist at edge of table for wrist  RD and UD  10 reps  And prayer stretch for wrist extention 10 reps  Min v/c for not  forcing - achor only - not weight bearing thru palm  BTE for CPM wrist extention 200 sec   1 lbs weight for wrist extention and flexion  16 hammer for RD, UD , Sup/pro  10 reps   Tendon glides AROM - focus on intrinsic fist - PROM and AROM  Thumb PA and RA  Opposition to all digits  Light blue putty for grip - not force end range  10 reps add to HEP  Med N glide - 5 reps - add to HEP and reviewed in clinic               OT Education - 05/11/16 1029    Education provided Yes   Education Details HEP changes    Person(s) Educated Patient   Methods Explanation;Demonstration;Tactile cues;Verbal cues   Comprehension Verbal cues required;Returned demonstration;Verbalized understanding          OT Short Term Goals - 05/03/16 1207      OT SHORT TERM GOAL #1   Title Fisting in R hand improve for pt to touch palm to hold 1"  objects during ADL's    Baseline MC's 65-80 and PIP 's 70-80's - pain in 3rd    Time 3   Period Weeks   Status New     OT SHORT TERM GOAL #2   Title Wrist AROM improve with 10-15 degrees in flexion, ext and UD to turn doorknob , push up from chair, sweep   Baseline R wrist ext 48, flexion 32, UD 14    Time 4   Period Weeks   Status New     OT SHORT TERM GOAL #3   Title Opposition improve to WNL and no pain for pt to do buttons    Baseline Pain opposition to 3rd and pain at thumb- unable to do buttons with R    Time 2   Period Weeks   Status New           OT Long Term Goals - 05/03/16 1210      OT LONG TERM GOAL #1   Title Function score on PRWHE improve with at least 15 points   Baseline At eval PRWHE fucntion score 27.5/50   Time 5   Period Weeks   Status New     OT LONG TERM GOAL #2   Title Grip strength in R hand improve to at least 50% compare to L hand to carry groceries     Baseline NT yet 5 wks out from fx   Time 6   Period Weeks   Status New     OT LONG TERM GOAL #3   Title R wrist strength improve to be able to cut  food, turn doorknob, push up from chair, and water aerobics   Baseline 5 wks out from fx - in splint still    Time 6   Period Weeks   Status New               Plan - 05/11/16 1030    Clinical Impression Statement Pt making progress and show decrease pain with doing 1lbs weight and hammer this date -cont to have CT symptoms in 3rd the worse and other comes and goes - appear to stil have swelling over volar wrist - but using it more - cont to increase ROM , strength - decrease CT and pain    Rehab Potential Good   OT Frequency 2x / week   OT Duration 4 weeks   OT Treatment/Interventions Self-care/ADL training;Fluidtherapy;Splinting;Patient/family education;Therapeutic exercises;Contrast Bath;Passive range of motion;Manual Therapy   OT Home Exercise Plan see pt instruction    Consulted and Agree with Plan of Care Patient      Patient will benefit from skilled therapeutic intervention in order to improve the following deficits and impairments:  Decreased coordination, Decreased range of motion, Impaired flexibility, Increased edema, Impaired sensation, Decreased knowledge of precautions, Impaired UE functional use, Pain, Decreased strength  Visit Diagnosis: Pain in right wrist  Pain in right hand  Stiffness of right hand, not elsewhere classified  Muscle weakness (generalized)  Stiffness of right wrist, not elsewhere classified    Problem List Patient Active Problem List   Diagnosis Date Noted  . Head mass 01/22/2014  . Dermoid cyst of scalp 01/22/2014    Rosalyn Gess OTR/L,CLT  05/11/2016, 12:23 PM  [place] [facility] [address] [place], [address] Phone: [phone]   Fax:  [phone]  Name: Diane Weiss MRN: [id number] Date of Birth: 05-Jan-1951

## 2016-05-11 NOTE — Patient Instructions (Addendum)
Contrast because of CT symptoms  And prayer stretch for wrist extention 10 reps  Min v/c for not forcing - achor only - not weight bearing thru palm  PROM for RD, UD , ext,flexion over edge of table   1 lbs weight for wrist extention and flexion  16 hammer for RD, UD , Sup/pro  10 reps   Tendon glides AROM - focus on intrinsic fist - PROM and AROM  Thumb PA and RA  Opposition to all digits  Light blue putty for grip - not force end range - stop if CT symptoms increase 10 reps add to HEP  Med N glide - 5 reps - add to HEP and reviewed in clinic Can do ice at end

## 2016-05-17 ENCOUNTER — Ambulatory Visit: Payer: PPO | Attending: Orthopedic Surgery | Admitting: Occupational Therapy

## 2016-05-17 DIAGNOSIS — M25631 Stiffness of right wrist, not elsewhere classified: Secondary | ICD-10-CM

## 2016-05-17 DIAGNOSIS — G5601 Carpal tunnel syndrome, right upper limb: Secondary | ICD-10-CM | POA: Diagnosis not present

## 2016-05-17 DIAGNOSIS — S52501D Unspecified fracture of the lower end of right radius, subsequent encounter for closed fracture with routine healing: Secondary | ICD-10-CM | POA: Diagnosis not present

## 2016-05-17 DIAGNOSIS — M25531 Pain in right wrist: Secondary | ICD-10-CM | POA: Diagnosis not present

## 2016-05-17 DIAGNOSIS — M25641 Stiffness of right hand, not elsewhere classified: Secondary | ICD-10-CM | POA: Insufficient documentation

## 2016-05-17 DIAGNOSIS — M79641 Pain in right hand: Secondary | ICD-10-CM

## 2016-05-17 DIAGNOSIS — M6281 Muscle weakness (generalized): Secondary | ICD-10-CM | POA: Insufficient documentation

## 2016-05-17 DIAGNOSIS — G90511 Complex regional pain syndrome I of right upper limb: Secondary | ICD-10-CM | POA: Diagnosis not present

## 2016-05-17 NOTE — Patient Instructions (Addendum)
Same HEP as last time but add lateral grip for putty  Tendon glides AROM - Focus on  Gentle PROM for  intrinsic fist - blocked AROM 2 x 10 reps  Gentle PROM for composite flexion  Place and hold  AROM to palm Thumb PA and RA  Opposition to all digits  Light blue putty for grip - not force end range - and add lateral grip without increase symptoms , but had some pain with 3 point grip - pt to hold off 10 reps        1 lbs weight for wrist extention and flexion  16 hammer for RD, UD , Sup/pro 10 reps Med N glide - 5 reps Can wean out of splints 2 hrs on and off

## 2016-05-17 NOTE — Therapy (Signed)
[place] [facility] [address] [place], [address] Phone: [phone]   Fax:  [phone]  Occupational Therapy Treatment  Patient Details  Name: Diane Weiss MRN: [id number] Date of Birth: 10-May-1951 Referring Provider: Prescott Parma  Encounter Date: 05/17/2016      OT End of Session - 05/17/16 1403    Visit Number 4   Number of Visits 12   Date for OT Re-Evaluation 06/14/16   OT Start Time 1350   OT Stop Time 1430   OT Time Calculation (min) 40 min   Activity Tolerance Patient tolerated treatment well   Behavior During Therapy [facility] for tasks assessed/performed      Past Medical History:  Diagnosis Date  . History of stomach ulcers   . Hyperlipidemia   . Thyroid disease     Past Surgical History:  Procedure Laterality Date  . KNEE SURGERY  1990  . STOMACH SURGERY  1983   bleeding ulcers    There were no vitals filed for this visit.      Subjective Assessment - 05/17/16 1402    Subjective  Seen [facility] - ordering nerve conduction test- doing better with my ROM and strength - using it more - making gist better    Patient Stated Goals I want to use my hand again - write better , cutting food, open jars, dress, fix hair , do water aerobics , and back to work    Currently in Pain? No/denies            [facility] OT Assessment - 05/17/16 0001      AROM   Right Wrist Extension 57 Degrees   Right Wrist Flexion 53 Degrees   Right Wrist Radial Deviation 25 Degrees   Right Wrist Ulnar Deviation 20 Degrees     Strength   Right Hand Grip (lbs) 13   Right Hand Lateral Pinch 10 lbs   Right Hand 3 Point Pinch 5 lbs   Left Hand Grip (lbs) 58   Left Hand Lateral Pinch 16 lbs   Left Hand 3 Point Pinch 14 lbs                  OT Treatments/Exercises (OP) - 05/17/16 0001      RUE Fluidotherapy   Number Minutes Fluidotherapy 10 Minutes   RUE Fluidotherapy Location Hand;Wrist   Comments At [facility]  to increaes ROM and decrease pain       after fluido Soft tissue mobs to CT - spreads,  Soft tissue massage to lateral bands of PIP 's with gentle traction - prior to ROM  To increase composite flexion of digits  Tendon glides AROM -  Gentle PROM for  intrinsic fist - blocked AROM 2 x 10 reps  Gentle PROM for composite flexion  Place and hold  AROM to palm Thumb PA and RA  Opposition to all digits  Light blue putty for grip - not force end range - and add lateral grip without increase symptoms , but had some pain with 3 point grip - pt to hold off 10 reps     BTE for CPM wrist extention 200 sec   1 lbs weight for wrist extention and flexion  16 hammer for RD, UD , Sup/pro 10 reps  Needed mod A  - pt was doing hammer for flexion and ext, 1 lbs for sup/pro   Med N glide - 5 reps Can wean out of splints 2 hrs  on and off             OT Education - 05/17/16 1403    Education provided Yes   Education Details HEP update   Person(s) Educated Patient   Methods Explanation;Demonstration;Tactile cues;Verbal cues   Comprehension Verbal cues required;Returned demonstration;Verbalized understanding          OT Short Term Goals - 05/03/16 1207      OT SHORT TERM GOAL #1   Title Fisting in R hand improve for pt to touch palm to hold 1" objects during ADL's    Baseline MC's 65-80 and PIP 's 70-80's - pain in 3rd    Time 3   Period Weeks   Status New     OT SHORT TERM GOAL #2   Title Wrist AROM improve with 10-15 degrees in flexion, ext and UD to turn doorknob , push up from chair, sweep   Baseline R wrist ext 48, flexion 32, UD 14    Time 4   Period Weeks   Status New     OT SHORT TERM GOAL #3   Title Opposition improve to WNL and no pain for pt to do buttons    Baseline Pain opposition to 3rd and pain at thumb- unable to do buttons with R    Time 2   Period Weeks   Status New           OT Long Term Goals - 05/03/16 1210      OT LONG TERM GOAL #1    Title Function score on PRWHE improve with at least 15 points   Baseline At eval PRWHE fucntion score 27.5/50   Time 5   Period Weeks   Status New     OT LONG TERM GOAL #2   Title Grip strength in R hand improve to at least 50% compare to L hand to carry groceries     Baseline NT yet 5 wks out from fx   Time 6   Period Weeks   Status New     OT LONG TERM GOAL #3   Title R wrist strength improve to be able to cut food, turn doorknob, push up from chair, and water aerobics   Baseline 5 wks out from fx - in splint still    Time 6   Period Weeks   Status New               Plan - 05/17/16 1404    Clinical Impression Statement Pt making progress in AROM at wrist and digits - pain improving but numbness still in digits - PA did order this am nerve conduction test - pt to wean 2 hrs on and off splint until next time    Rehab Potential Good   OT Frequency 2x / week   OT Duration 4 weeks   OT Treatment/Interventions Self-care/ADL training;Fluidtherapy;Splinting;Patient/family education;Therapeutic exercises;Contrast Bath;Passive range of motion;Manual Therapy   Plan assess progress and if can upgrade to 2 lbs for wrist   OT Home Exercise Plan see pt instruction    Consulted and Agree with Plan of Care Patient      Patient will benefit from skilled therapeutic intervention in order to improve the following deficits and impairments:  Decreased coordination, Decreased range of motion, Impaired flexibility, Increased edema, Impaired sensation, Decreased knowledge of precautions, Impaired UE functional use, Pain, Decreased strength  Visit Diagnosis: Pain in right wrist  Pain in right hand  Stiffness of right hand, not elsewhere classified  Muscle weakness (generalized)  Stiffness of right wrist, not elsewhere classified    Problem List Patient Active Problem List   Diagnosis Date Noted  . Head mass 01/22/2014  . Dermoid cyst of scalp 01/22/2014    Rosalyn Gess   OTR/L,CLT  05/17/2016, 2:44 PM  [place] [facility] [address], [place], [address] Phone: [phone]   Fax:  [phone]  Name: Diane Weiss MRN: [id number] Date of Birth: 26-Apr-1951

## 2016-05-19 ENCOUNTER — Ambulatory Visit: Payer: PPO | Admitting: Occupational Therapy

## 2016-05-19 DIAGNOSIS — M25531 Pain in right wrist: Secondary | ICD-10-CM

## 2016-05-19 DIAGNOSIS — M25641 Stiffness of right hand, not elsewhere classified: Secondary | ICD-10-CM

## 2016-05-19 DIAGNOSIS — M25631 Stiffness of right wrist, not elsewhere classified: Secondary | ICD-10-CM

## 2016-05-19 DIAGNOSIS — M6281 Muscle weakness (generalized): Secondary | ICD-10-CM

## 2016-05-19 DIAGNOSIS — M79641 Pain in right hand: Secondary | ICD-10-CM

## 2016-05-19 NOTE — Patient Instructions (Addendum)
Tendon glides AROM   Upgrade to teal putty - had pain about 1/10 at volar wrist - pt to do but if increase pain - decrease to Light blue putty for grip - not force end range - add lateral grip without increase symptoms , but had some pain with 3 point grip - pt to hold off - but do light blue putty - pain with teal with lateral grip  10 reps   2 lbs weight for wrist extention and flexion , RD, UD , Sup/pro 10 reps  Med N glide - 5 reps Can wean out of splints 2 hrs on and off

## 2016-05-19 NOTE — Therapy (Signed)
[place] [address] [place], [address] Phone: [phone]   Fax:  [phone]  Occupational Therapy Treatment  Patient Details  Name: Diane Weiss MRN: [id number] Date of Birth: 1950/10/01 Referring Provider: Prescott Parma  Encounter Date: 05/19/2016      OT End of Session - 05/19/16 0951    Visit Number 5   Number of Visits 12   Date for OT Re-Evaluation 06/14/16   OT Start Time 0935   OT Stop Time 1016   OT Time Calculation (min) 41 min   Activity Tolerance Patient tolerated treatment well   Behavior During Therapy [facility] for tasks assessed/performed      Past Medical History:  Diagnosis Date  . History of stomach ulcers   . Hyperlipidemia   . Thyroid disease     Past Surgical History:  Procedure Laterality Date  . KNEE SURGERY  1990  . STOMACH SURGERY  1983   bleeding ulcers    There were no vitals filed for this visit.      Subjective Assessment - 05/19/16 0949    Subjective  Nerve conduction not until 20th - numbness still in middle and ring finger- pain not more than 1 - sometimes at my wrist - they ask me at work when I am coming back   Patient Stated Goals I want to use my hand again - write better , cutting food, open jars, dress, fix hair , do water aerobics , and back to work    Currently in Pain? No/denies            [facility] OT Assessment - 05/19/16 0001      Right Hand AROM   R Index  MCP 0-90 85 Degrees   R Long  MCP 0-90 90 Degrees   R Ring  MCP 0-90 100 Degrees   R Little  MCP 0-90 95 Degrees                  OT Treatments/Exercises (OP) - 05/19/16 0001      RUE Fluidotherapy   Number Minutes Fluidotherapy 10 Minutes   RUE Fluidotherapy Location Hand;Wrist   Comments at [facility] to decrease pain and increase ROM     after fluido Soft tissue mobs to CT - spreads, - Graston tools nr 2 and 4 over CT and volar wrist /forearm prior to there ex , to increase  flexion and extention at wrist - decrease CT symptoms   To increase composite flexion of digits  Tendon glides AROM -  Gentle PROM for  intrinsic fist - blocked AROM  10 reps  Gentle PROM for composite flexion  Place and hold  AROM to palm Thumb PA and RA  Upgrade to teal putty - had pain about 1/10 at volar wrist - pt to do but if increase pain - decrease to Light blue putty for grip - not force end range - add lateral grip without increase symptoms , but had some pain with 3 point grip - pt to hold off - but do light blue putty - pain with teal with lateral grip  10 reps     BTE for CPM wrist extention 200 sec  Flexion of CPM for wrist - pain at volar wrist   2 lbs weight for wrist extention and flexion , RD, UD , Sup/pro 10 reps  Had no increase pain - able to do good range Add to HEP   Med N glide -  5 reps Can wean out of splints 2 hrs on and off             OT Education - 05/19/16 0950    Education provided Yes   Education Details HEP update   Person(s) Educated Patient   Methods Explanation;Demonstration;Tactile cues;Handout;Verbal cues   Comprehension Verbal cues required;Returned demonstration;Verbalized understanding          OT Short Term Goals - 05/03/16 1207      OT SHORT TERM GOAL #1   Title Fisting in R hand improve for pt to touch palm to hold 1" objects during ADL's    Baseline MC's 65-80 and PIP 's 70-80's - pain in 3rd    Time 3   Period Weeks   Status New     OT SHORT TERM GOAL #2   Title Wrist AROM improve with 10-15 degrees in flexion, ext and UD to turn doorknob , push up from chair, sweep   Baseline R wrist ext 48, flexion 32, UD 14    Time 4   Period Weeks   Status New     OT SHORT TERM GOAL #3   Title Opposition improve to WNL and no pain for pt to do buttons    Baseline Pain opposition to 3rd and pain at thumb- unable to do buttons with R    Time 2   Period Weeks   Status New           OT Long Term Goals -  05/03/16 1210      OT LONG TERM GOAL #1   Title Function score on PRWHE improve with at least 15 points   Baseline At eval PRWHE fucntion score 27.5/50   Time 5   Period Weeks   Status New     OT LONG TERM GOAL #2   Title Grip strength in R hand improve to at least 50% compare to L hand to carry groceries     Baseline NT yet 5 wks out from fx   Time 6   Period Weeks   Status New     OT LONG TERM GOAL #3   Title R wrist strength improve to be able to cut food, turn doorknob, push up from chair, and water aerobics   Baseline 5 wks out from fx - in splint still    Time 6   Period Weeks   Status New               Plan - 05/19/16 [id number]    Clinical Impression Statement Pt cont to show progress in strength at wrist - AROM at wrist flexion and extnention still in 50"s  - CT symptoms still limiting upgrading her putty for gripping and  increasing wrist flexion - cont to have numbness in 3rd mostly - pt scheduled for  nerve conduction 20th Sept    Rehab Potential Good   OT Frequency 2x / week   OT Duration 4 weeks   OT Treatment/Interventions Self-care/ADL training;Fluidtherapy;Splinting;Patient/family education;Therapeutic exercises;Contrast Bath;Passive range of motion;Manual Therapy   OT Home Exercise Plan see pt instruction    Consulted and Agree with Plan of Care Patient      Patient will benefit from skilled therapeutic intervention in order to improve the following deficits and impairments:  Decreased coordination, Decreased range of motion, Impaired flexibility, Increased edema, Impaired sensation, Decreased knowledge of precautions, Impaired UE functional use, Pain, Decreased strength  Visit Diagnosis: Pain in right hand  Stiffness of right hand, not  elsewhere classified  Muscle weakness (generalized)  Stiffness of right wrist, not elsewhere classified  Pain in right wrist    Problem List Patient Active Problem List   Diagnosis Date Noted  . Head mass  01/22/2014  . Dermoid cyst of scalp 01/22/2014    Rosalyn Gess OTR/L,CLT  05/19/2016, 8:04 PM  [place] [facility] [address], [place], [address] Phone: [phone]   Fax:  [phone]  Name: Diane Weiss MRN: [id number] Date of Birth: Feb 19, 1951

## 2016-05-25 ENCOUNTER — Ambulatory Visit: Payer: PPO | Admitting: Occupational Therapy

## 2016-05-25 DIAGNOSIS — M25631 Stiffness of right wrist, not elsewhere classified: Secondary | ICD-10-CM

## 2016-05-25 DIAGNOSIS — M25641 Stiffness of right hand, not elsewhere classified: Secondary | ICD-10-CM

## 2016-05-25 DIAGNOSIS — M6281 Muscle weakness (generalized): Secondary | ICD-10-CM

## 2016-05-25 DIAGNOSIS — M79641 Pain in right hand: Secondary | ICD-10-CM

## 2016-05-25 DIAGNOSIS — M25531 Pain in right wrist: Secondary | ICD-10-CM

## 2016-05-25 NOTE — Therapy (Signed)
[place] [address] [place], [address] Phone: [phone]   Fax:  [phone]  Occupational Therapy Treatment  Patient Details  Name: Diane Weiss MRN: [id number] Date of Birth: 1951-06-21 Referring Provider: Prescott Parma  Encounter Date: 05/25/2016      OT End of Session - 05/25/16 1313    Visit Number 6   Number of Visits 12   Date for OT Re-Evaluation 06/14/16   OT Start Time 1301   OT Stop Time 1344   OT Time Calculation (min) 43 min   Activity Tolerance Patient tolerated treatment well   Behavior During Therapy [facility] for tasks assessed/performed      Past Medical History:  Diagnosis Date  . History of stomach ulcers   . Hyperlipidemia   . Thyroid disease     Past Surgical History:  Procedure Laterality Date  . KNEE SURGERY  1990  . STOMACH SURGERY  1983   bleeding ulcers    There were no vitals filed for this visit.      Subjective Assessment - 05/25/16 1312    Subjective  Using it more - just some pain like turning doorknob, lifting gallon of milk  - numbness till the same , with 3rd and 2nd the worse - nerve conduction  next Wed    Patient Stated Goals I want to use my hand again - write better , cutting food, open jars, dress, fix hair , do water aerobics , and back to work    Currently in Pain? No/denies            [facility] OT Assessment - 05/25/16 0001      AROM   Right Wrist Extension 58 Degrees   Right Wrist Flexion 58 Degrees     Strength   Right Hand Grip (lbs) 16   Right Hand Lateral Pinch 12 lbs   Right Hand 3 Point Pinch 6 lbs   Left Hand Grip (lbs) 58   Left Hand Lateral Pinch 16 lbs   Left Hand 3 Point Pinch 14 lbs     Right Hand AROM   R Index  MCP 0-90 85 Degrees   R Index PIP 0-100 90 Degrees   R Long  MCP 0-90 85 Degrees   R Long PIP 0-100 90 Degrees   R Ring  MCP 0-90 90 Degrees   R Ring PIP 0-100 100 Degrees   R Little  MCP 0-90 90 Degrees   R  Little PIP 0-100 90 Degrees                  OT Treatments/Exercises (OP) - 05/25/16 0001      RUE Fluidotherapy   Number Minutes Fluidotherapy 8 Minutes   RUE Fluidotherapy Location Hand;Wrist   Comments at [facility] to decrease stiffness in digits and increase ROM at wrist , decrease pain     Measurements taken for ROM at wrist and digits and grip /prehension   fluido Tendon glides AROM teal putty - grip but stop when pain more than 1/10  - 12 x   lateral grip into ball 12 reps 3 point done - if putty need to be more flat and pinch around sides - 3 x around   Add to HEP   BTE for CPM wrist extention 200 sec 2lbs weight wrist extention 12 reps   Flexion of CPM for wrist 200 sec  Wrist flexion 20 lbs 100 sec  On BTE  Pain  with flexion on volar wrist - but 1/10   2 lbs weight for RD, UD , Sup/pro - for sup/pro hold weight at end for at home  10 reps  Had no increase pain                OT Education - 05/25/16 1313    Education provided Yes   Education Details HEP update or changes   Person(s) Educated Patient   Methods Explanation;Tactile cues;Verbal cues;Demonstration   Comprehension Verbalized understanding;Returned demonstration;Verbal cues required          OT Short Term Goals - 05/03/16 1207      OT SHORT TERM GOAL #1   Title Fisting in R hand improve for pt to touch palm to hold 1" objects during ADL's    Baseline MC's 65-80 and PIP 's 70-80's - pain in 3rd    Time 3   Period Weeks   Status New     OT SHORT TERM GOAL #2   Title Wrist AROM improve with 10-15 degrees in flexion, ext and UD to turn doorknob , push up from chair, sweep   Baseline R wrist ext 48, flexion 32, UD 14    Time 4   Period Weeks   Status New     OT SHORT TERM GOAL #3   Title Opposition improve to WNL and no pain for pt to do buttons    Baseline Pain opposition to 3rd and pain at thumb- unable to do buttons with R    Time 2   Period Weeks   Status New            OT Long Term Goals - 05/03/16 1210      OT LONG TERM GOAL #1   Title Function score on PRWHE improve with at least 15 points   Baseline At eval PRWHE fucntion score 27.5/50   Time 5   Period Weeks   Status New     OT LONG TERM GOAL #2   Title Grip strength in R hand improve to at least 50% compare to L hand to carry groceries     Baseline NT yet 5 wks out from fx   Time 6   Period Weeks   Status New     OT LONG TERM GOAL #3   Title R wrist strength improve to be able to cut food, turn doorknob, push up from chair, and water aerobics   Baseline 5 wks out from fx - in splint still    Time 6   Period Weeks   Status New               Plan - 05/25/16 1314    Clinical Impression Statement Pt cont to show progress in AROM at wrist , grip and prehension - slow but steady - CTS still present and increase with some activities - has nerve conduction test Wed - decrease last week to 1 x wk    Rehab Potential Good   OT Frequency 1x / week   OT Duration 4 weeks   OT Treatment/Interventions Self-care/ADL training;Fluidtherapy;Splinting;Patient/family education;Therapeutic exercises;Contrast Bath;Passive range of motion;Manual Therapy   Plan check on results for nerve conduction test   OT Home Exercise Plan see pt instruction    Consulted and Agree with Plan of Care Patient      Patient will benefit from skilled therapeutic intervention in order to improve the following deficits and impairments:  Decreased coordination, Decreased range of motion, Impaired flexibility, Increased  edema, Impaired sensation, Decreased knowledge of precautions, Impaired UE functional use, Pain, Decreased strength  Visit Diagnosis: Pain in right hand  Stiffness of right hand, not elsewhere classified  Muscle weakness (generalized)  Stiffness of right wrist, not elsewhere classified  Pain in right wrist    Problem List Patient Active Problem List   Diagnosis Date Noted  . Head  mass 01/22/2014  . Dermoid cyst of scalp 01/22/2014    Diane Weiss OTR/L,CLT  05/25/2016, 1:49 PM  [place] [facility] [address] [place], [address] Phone: [phone]   Fax:  [phone]  Name: Diane Weiss MRN: [id number] Date of Birth: [birth year]

## 2016-05-25 NOTE — Patient Instructions (Signed)
Cont with HEP  Add teal putty -  3 point  -  putty need to be more flat and pinch around sides - 3 x around   Add to HEP     2 lbs weight for RD, UD , Sup/pro - for sup/pro hold weight at end for at home  10 reps  Had no increase pain

## 2016-05-31 DIAGNOSIS — H25813 Combined forms of age-related cataract, bilateral: Secondary | ICD-10-CM | POA: Diagnosis not present

## 2016-05-31 DIAGNOSIS — G5601 Carpal tunnel syndrome, right upper limb: Secondary | ICD-10-CM | POA: Diagnosis not present

## 2016-05-31 DIAGNOSIS — H524 Presbyopia: Secondary | ICD-10-CM | POA: Diagnosis not present

## 2016-06-01 ENCOUNTER — Ambulatory Visit: Payer: PPO | Admitting: Occupational Therapy

## 2016-06-01 DIAGNOSIS — M25641 Stiffness of right hand, not elsewhere classified: Secondary | ICD-10-CM

## 2016-06-01 DIAGNOSIS — M79641 Pain in right hand: Secondary | ICD-10-CM

## 2016-06-01 DIAGNOSIS — M25531 Pain in right wrist: Secondary | ICD-10-CM | POA: Diagnosis not present

## 2016-06-01 DIAGNOSIS — M6281 Muscle weakness (generalized): Secondary | ICD-10-CM

## 2016-06-01 DIAGNOSIS — M25631 Stiffness of right wrist, not elsewhere classified: Secondary | ICD-10-CM

## 2016-06-01 NOTE — Patient Instructions (Signed)
Until appt with ortho - can sleep with hard wrist splint  Cont with wrist HEP for ROM and weight Tendon glides  Med N glides Blocked 3rd digit flexion  Modify and avoid aggravating CT with grip or lat grip and wrist flexion

## 2016-06-01 NOTE — Therapy (Signed)
[place] [facility] [address], [place], [address] Phone: [phone]   Fax:  [phone]  Occupational Therapy Treatment  Patient Details  Name: Diane Weiss MRN: [id number] Date of Birth: 1950-12-19 Referring Provider: Prescott Parma  Encounter Date: 06/01/2016      OT End of Session - 06/01/16 1157    Visit Number 7   Number of Visits 12   Date for OT Re-Evaluation 06/14/16   OT Start Time 1109   OT Stop Time 1144   OT Time Calculation (min) 35 min   Activity Tolerance Patient tolerated treatment well   Behavior During Therapy [facility] for tasks assessed/performed      Past Medical History:  Diagnosis Date  . History of stomach ulcers   . Hyperlipidemia   . Thyroid disease     Past Surgical History:  Procedure Laterality Date  . KNEE SURGERY  1990  . STOMACH SURGERY  1983   bleeding ulcers    There were no vitals filed for this visit.      Subjective Assessment - 06/01/16 1152    Subjective  Doing okay - had nerve conduction yesterday - it is grade 3 out of 4 - he is sending report to ortho - so  I am now waiting for next step - still numbness in middle and ring finger    Patient Stated Goals I want to use my hand again - write better , cutting food, open jars, dress, fix hair , do water aerobics , and back to work    Currently in Pain? No/denies            [facility] OT Assessment - 06/01/16 0001      AROM   Right Wrist Extension 58 Degrees   Right Wrist Flexion 48 Degrees   Right Wrist Radial Deviation 20 Degrees   Right Wrist Ulnar Deviation 20 Degrees     Strength   Right Hand Grip (lbs) 10   Right Hand Lateral Pinch 13 lbs   Right Hand 3 Point Pinch 7 lbs   Left Hand Grip (lbs) 58   Left Hand Lateral Pinch 16 lbs   Left Hand 3 Point Pinch 14 lbs           Measurements taken for wrist AROM , grip and prehension - see flowsheet  Grip and wrist flexion decrease        OT  Treatments/Exercises (OP) - 06/01/16 0001      RUE Contrast Bath   Time 8 minutes   Comments At [facility]  to decrease pain and numbness with middle finger block flexion , composite fist      Soft tissue massage over CT , volar wrist with Graston tools - sweeping, brushing , using tool 2 and 4 prior to review of HEP - tight over volar wrist - and pain with blocked 3rd digit flexion  Tendon glides - intrinsic fist limited -  Med N glide  Pt to decrease to light blue putty if increase discomfort at CT with teal Pt can cont with wrist HEP  Hard wrist splint to wear at night time   [place] at 3.3MHZ , 1.0 intensity at 20% over CT and volar wrist at end to decrease CT symptoms             OT Education - 06/01/16 1157    Education provided Yes   Education Details HEP    Person(s) Educated Patient   Methods Explanation;Demonstration;Tactile  cues;Verbal cues   Comprehension Verbal cues required;Returned demonstration;Verbalized understanding          OT Short Term Goals - 06/01/16 1200      OT SHORT TERM GOAL #1   Title Fisting in R hand improve for pt to touch palm to hold 1" objects during ADL's    Status Achieved     OT SHORT TERM GOAL #2   Title Wrist AROM improve with 10-15 degrees in flexion, ext and UD to turn doorknob , push up from chair, sweep   Baseline Progress but still some pain with pushing up and turning doorknob   Time 3   Period Weeks   Status Partially Met     OT SHORT TERM GOAL #3   Title Opposition improve to WNL and no pain for pt to do buttons    Baseline Opposition to base of 5th - can do buttons but pain at end range   Time 3   Period Weeks   Status On-going           OT Long Term Goals - 06/01/16 1201      OT LONG TERM GOAL #1   Title Function score on PRWHE improve with at least 15 points   Baseline Pt using hand in most all activities - excpet open jar, push up or lift heavy - some discomfort   Time 3   Period Weeks   Status On-going      OT LONG TERM GOAL #2   Title Grip strength in R hand improve to at least 50% compare to L hand to carry groceries     Baseline Grip was 16 but no this week decrease to 10 lbs , L 58 lbs    Time 3   Period Weeks   Status On-going     OT LONG TERM GOAL #3   Title R wrist strength improve to be able to cut food, turn doorknob, push up from chair, and water aerobics   Baseline limited by CT symptoms    Time 4   Period Weeks   Status On-going               Plan - 06/01/16 1158    Clinical Impression Statement Pt showed great progress in ROM , pain and functional use from [facility] - but showed this date since last week decrease grip strength and wrist flexion - CT limiting pt with progress - had nerve conduction test that showed CT on R - pt await ortho to contact  her for next step - pt to phone me if hear plan  - to cont with homeprogram   Rehab Potential Good   OT Frequency 1x / week   OT Duration 2 weeks   OT Treatment/Interventions Self-care/ADL training;Fluidtherapy;Splinting;Patient/family education;Therapeutic exercises;Contrast Bath;Passive range of motion;Manual Therapy   Plan pt to contact me about plan from  ortho - surgery or appt    OT Home Exercise Plan see pt instruction    Consulted and Agree with Plan of Care Patient      Patient will benefit from skilled therapeutic intervention in order to improve the following deficits and impairments:  Decreased coordination, Decreased range of motion, Impaired flexibility, Increased edema, Impaired sensation, Decreased knowledge of precautions, Impaired UE functional use, Pain, Decreased strength  Visit Diagnosis: Pain in right hand  Stiffness of right hand, not elsewhere classified  Muscle weakness (generalized)  Stiffness of right wrist, not elsewhere classified  Pain in right wrist  Problem List Patient Active Problem List   Diagnosis Date Noted  . Head mass 01/22/2014  . Dermoid cyst of scalp 01/22/2014     Rosalyn Gess OTR/L,CLT 06/01/2016, 12:04 PM  [place] [facility] [address], [place], [address] Phone: [phone]   Fax:  [phone]  Name: Diane Weiss MRN: [id number] Date of Birth: 1950/10/28

## 2016-06-20 DIAGNOSIS — G5601 Carpal tunnel syndrome, right upper limb: Secondary | ICD-10-CM | POA: Diagnosis not present

## 2016-06-29 DIAGNOSIS — M546 Pain in thoracic spine: Secondary | ICD-10-CM | POA: Diagnosis not present

## 2016-06-29 DIAGNOSIS — M791 Myalgia: Secondary | ICD-10-CM | POA: Diagnosis not present

## 2016-06-29 DIAGNOSIS — G894 Chronic pain syndrome: Secondary | ICD-10-CM | POA: Diagnosis not present

## 2016-06-30 ENCOUNTER — Encounter: Payer: Self-pay | Admitting: *Deleted

## 2016-07-03 ENCOUNTER — Encounter: Payer: Self-pay | Admitting: Anesthesiology

## 2016-07-07 ENCOUNTER — Ambulatory Visit: Payer: PPO | Admitting: Anesthesiology

## 2016-07-07 ENCOUNTER — Ambulatory Visit
Admission: RE | Admit: 2016-07-07 | Discharge: 2016-07-07 | Disposition: A | Discharge status: Home / Self Care | Payer: PPO | Source: Ambulatory Visit | Attending: Unknown Physician Specialty | Admitting: Unknown Physician Specialty

## 2016-07-07 ENCOUNTER — Encounter
Admission: RE | Disposition: A | Discharge status: Home / Self Care | Payer: Self-pay | Source: Ambulatory Visit | Attending: Unknown Physician Specialty

## 2016-07-07 DIAGNOSIS — E785 Hyperlipidemia, unspecified: Secondary | ICD-10-CM | POA: Insufficient documentation

## 2016-07-07 DIAGNOSIS — E039 Hypothyroidism, unspecified: Secondary | ICD-10-CM | POA: Insufficient documentation

## 2016-07-07 DIAGNOSIS — Z87891 Personal history of nicotine dependence: Secondary | ICD-10-CM | POA: Insufficient documentation

## 2016-07-07 DIAGNOSIS — Z79899 Other long term (current) drug therapy: Secondary | ICD-10-CM | POA: Insufficient documentation

## 2016-07-07 DIAGNOSIS — G5601 Carpal tunnel syndrome, right upper limb: Secondary | ICD-10-CM | POA: Insufficient documentation

## 2016-07-07 HISTORY — PX: CARPAL TUNNEL RELEASE: SHX101

## 2016-07-07 HISTORY — DX: Presence of dental prosthetic device (complete) (partial): Z97.2

## 2016-07-07 HISTORY — DX: Dizziness and giddiness: R42

## 2016-07-07 SURGERY — CARPAL TUNNEL RELEASE
Anesthesia: Monitor Anesthesia Care | Laterality: Right | Wound class: Clean

## 2016-07-07 MED ORDER — MIDAZOLAM HCL 2 MG/2ML IJ SOLN
INTRAMUSCULAR | Status: DC | PRN
  Administered 2016-07-07: 2 mg via INTRAVENOUS

## 2016-07-07 MED ORDER — FENTANYL CITRATE (PF) 100 MCG/2ML IJ SOLN: 25.0000 ug | INTRAMUSCULAR | Status: DC | PRN

## 2016-07-07 MED ORDER — ROPIVACAINE HCL 5 MG/ML IJ SOLN
INTRAMUSCULAR | Status: DC | PRN
  Administered 2016-07-07: 30 mL via PERINEURAL

## 2016-07-07 MED ORDER — ONDANSETRON HCL 4 MG/2ML IJ SOLN
INTRAMUSCULAR | Status: DC | PRN
  Administered 2016-07-07: 4 mg via INTRAVENOUS

## 2016-07-07 MED ORDER — OXYCODONE HCL 5 MG/5ML PO SOLN: 5.0000 mg | Freq: Once | ORAL | Status: DC | PRN

## 2016-07-07 MED ORDER — GLYCOPYRROLATE 0.2 MG/ML IJ SOLN
INTRAMUSCULAR | Status: DC | PRN
  Administered 2016-07-07: 0.2 mg via INTRAVENOUS

## 2016-07-07 MED ORDER — KETAMINE HCL 100 MG/ML IJ SOLN
INTRAMUSCULAR | Status: DC | PRN
  Administered 2016-07-07: 30 mg via INTRAVENOUS

## 2016-07-07 MED ORDER — FENTANYL CITRATE (PF) 100 MCG/2ML IJ SOLN
INTRAMUSCULAR | Status: DC | PRN
  Administered 2016-07-07: 100 ug via INTRAVENOUS

## 2016-07-07 MED ORDER — OXYCODONE HCL 5 MG PO TABS: 5.0000 mg | ORAL_TABLET | Freq: Once | ORAL | Status: DC | PRN

## 2016-07-07 MED ORDER — NORCO 5-325 MG PO TABS: 1.0000 | ORAL_TABLET | Freq: Four times a day (QID) | ORAL | 0 refills | Status: DC | PRN

## 2016-07-07 MED ORDER — LIDOCAINE HCL (CARDIAC) 20 MG/ML IV SOLN
INTRAVENOUS | Status: DC | PRN
  Administered 2016-07-07: 40 mg via INTRAVENOUS

## 2016-07-07 MED ORDER — LACTATED RINGERS IV SOLN
INTRAVENOUS | Status: DC
  Administered 2016-07-07: 08:00:00 via INTRAVENOUS

## 2016-07-07 MED ORDER — PROPOFOL 500 MG/50ML IV EMUL
INTRAVENOUS | Status: DC | PRN
Start: 2016-07-07 — End: 2016-07-07
  Administered 2016-07-07: 50 ug/kg/min via INTRAVENOUS

## 2016-07-07 MED ORDER — ACETAMINOPHEN 160 MG/5ML PO SOLN: 325.0000 mg | ORAL | Status: DC | PRN

## 2016-07-07 MED ORDER — ACETAMINOPHEN 325 MG PO TABS: 325.0000 mg | ORAL_TABLET | ORAL | Status: DC | PRN

## 2016-07-07 SURGICAL SUPPLY — 28 items
BANDAGE ELASTIC 2 CLIP NS LF (GAUZE/BANDAGES/DRESSINGS) ×3 IMPLANT
BNDG ESMARK 4X12 TAN STRL LF (GAUZE/BANDAGES/DRESSINGS) ×3 IMPLANT
BNDG STRETCH 4X75 STRL LF (GAUZE/BANDAGES/DRESSINGS) ×3 IMPLANT
COVER LIGHT HANDLE FLEXIBLE (MISCELLANEOUS) ×3 IMPLANT
CUFF TOURN SGL QUICK 18 (TOURNIQUET CUFF) ×3 IMPLANT
DURAPREP 26ML APPLICATOR (WOUND CARE) ×3 IMPLANT
GAUZE SPONGE 4X4 12PLY STRL (GAUZE/BANDAGES/DRESSINGS) ×3 IMPLANT
GLOVE BIO SURGEON STRL SZ7.5 (GLOVE) ×3 IMPLANT
GLOVE BIO SURGEON STRL SZ8 (GLOVE) ×6 IMPLANT
GLOVE INDICATOR 8.0 STRL GRN (GLOVE) ×3 IMPLANT
GOWN STRL REUS W/ TWL LRG LVL3 (GOWN DISPOSABLE) ×2 IMPLANT
GOWN STRL REUS W/TWL LRG LVL3 (GOWN DISPOSABLE) ×4
KIT ROOM TURNOVER OR (KITS) ×3 IMPLANT
LOOP VESSEL RED MINI 1.3X0.9 (MISCELLANEOUS) ×1 IMPLANT
LOOPS RED MINI 1.3MMX0.9MM (MISCELLANEOUS) ×2
NS IRRIG 500ML POUR BTL (IV SOLUTION) ×3 IMPLANT
PACK EXTREMITY ARMC (MISCELLANEOUS) ×3 IMPLANT
PAD GROUND ADULT SPLIT (MISCELLANEOUS) ×3 IMPLANT
PADDING CAST 2X4YD ST (MISCELLANEOUS) ×2
PADDING CAST BLEND 2X4 STRL (MISCELLANEOUS) ×1 IMPLANT
SOL PREP PVP 2OZ (MISCELLANEOUS) ×3
SOLUTION PREP PVP 2OZ (MISCELLANEOUS) ×1 IMPLANT
SPLINT CAST 1 STEP 3X12 (MISCELLANEOUS) ×3 IMPLANT
STOCKINETTE 4X48 STRL (DRAPES) ×3 IMPLANT
STRAP BODY AND KNEE 60X3 (MISCELLANEOUS) ×3 IMPLANT
SUT ETHILON 4-0 (SUTURE) ×2
SUT ETHILON 4-0 FS2 18XMFL BLK (SUTURE) ×1
SUTURE ETHLN 4-0 FS2 18XMF BLK (SUTURE) ×1 IMPLANT

## 2016-07-07 NOTE — Discharge Instructions (Signed)
General Anesthesia, Adult, Care After Refer to this sheet in the next few weeks. These instructions provide you with information on caring for yourself after your procedure. Your health care provider may also give you more specific instructions. Your treatment has been planned according to current medical practices, but problems sometimes occur. Call your health care provider if you have any problems or questions after your procedure. WHAT TO EXPECT AFTER THE PROCEDURE After the procedure, it is typical to experience:  Sleepiness.  Nausea and vomiting. HOME CARE INSTRUCTIONS  For the first 24 hours after general anesthesia:  Have a responsible person with you.  Do not drive a car. If you are alone, do not take public transportation.  Do not drink alcohol.  Elevation Ice pack RTC in about 10 days  Do not take medicine that has not been prescribed by your health care provider.  Do not sign important papers or make important decisions.  You may resume a normal diet and activities as directed by your health care provider.  Change bandages (dressings) as directed.  If you have questions or problems that seem related to general anesthesia, call the hospital and ask for the anesthetist or anesthesiologist on call. SEEK MEDICAL CARE IF:  You have nausea and vomiting that continue the day after anesthesia.  You develop a rash. SEEK IMMEDIATE MEDICAL CARE IF:   You have difficulty breathing.  You have chest pain.  You have any allergic problems.   This information is not intended to replace advice given to you by your health care provider. Make sure you discuss any questions you have with your health care provider.   Document Released: 12/04/2000 Document Revised: 09/18/2014 Document Reviewed: 12/27/2011 Elsevier Interactive Patient Education [employer].

## 2016-07-07 NOTE — Op Note (Signed)
DATE OF SURGERY:  07/07/2016  PATIENT NAME:  Diane Weiss   DOB: 1950-10-21  MRN: [id number]  PRE-OPERATIVE DIAGNOSIS: R carpal tunnel syndrome  POST-OPERATIVE DIAGNOSIS:  Same  PROCEDURE:R carpal tunnel release  SURGEON: Dr. Leanor Kail, Brooke Bonito. M.D.  ANESTHESIA: Supraclavicular block   INDICATIONS FOR SURGERY: [name] is a 65 y.o. year old female with a long history of numbness and paresthesias in the right hand. Nerve conduction studies demonstrated findings consistent with moderately severe right median nerve compression.The patient had not seen any significant improvement despite conservative nonsurgical intervention. After discussion of the risks and benefits of surgical intervention, the patient expressed understanding of the risks benefits and agreed with plans for carpal tunnel release.   PROCEDURE IN DETAIL: The patient was taken the operating room where satisfactory general anesthesia was achieved. A tourniquet was placed on the patient's right upper arm.The right hand and arm were prepped  and draped in the usual sterile fashion. A "time-out" was performed as per usual protocol. The hand and forearm were exsanguinated using an Esmarch and the tourniquet was inflated to 250 mmHg.  An incision was made just ulnar to the thenar palmar crease. Dissection was carried down through the palmar fascia to the transverse carpal ligament. The transverse carpal ligament was sharply incised, taking care to protect the underlying structures within the carpal tunnel. Complete release of the transverse carpal ligament was achieved. There was no evidence of a mass or proliferative synovitis within the carpal tunnel. The median nerve did appear to be slightly flattened. The tourniquet was released at this time. It had been up for about 6 minutes. Bleeding was controlled with digital pressure and coagulation cautery. I did inject the subcutaneous tissue of the wound with about 5 cc of 0.5%  Marcaine without epinephrine. The skin was then re-approximated with interrupted sutures of #4-0 nylon. A sterile dressing was applied followed by application of a volar splint.  The patient was awakened and transferred to a stretcher bed.  The patient tolerated the procedure well and was transported to the PACU in stable condition. Blood loss was negligible.  Dr. Mariel Kansky. M.D.

## 2016-07-07 NOTE — Progress Notes (Signed)
Assisted Marchia Bond ANMD with right, ultrasound guided, supraclavicular block. Side rails up, monitors on throughout procedure. See vital signs in flow sheet. Tolerated Procedure well.

## 2016-07-07 NOTE — Anesthesia Procedure Notes (Signed)
Anesthesia Regional Block:  Supraclavicular block  Pre-Anesthetic Checklist: ,, timeout performed, Correct Patient, Correct Site, Correct Laterality, Correct Procedure, Correct Position, site marked, Risks and benefits discussed,  Surgical consent,  Pre-op evaluation,  At surgeon's request and post-op pain management   Prep: chloraprep       Needles:  Injection technique: Single-shot  Needle Type: Echogenic Stimulator Needle     Needle Length: 4cm 4 cm Needle Gauge: 21 and 21 G    Additional Needles:  Procedures: ultrasound guided (picture in chart) Supraclavicular block  Nerve Stimulator or Paresthesia:  Response: bicep contraction, 0.45 mA,   Additional Responses:   Narrative:  Start time: 07/07/2016 8:10 AM End time: 07/07/2016 8:15 AM Injection made incrementally with aspirations every 5 mL.  Performed by: Personally  Anesthesiologist: Marchia Bond D  Additional Notes: Functioning IV was confirmed and monitors applied.  Sterile prep and drape,hand hygiene and sterile gloves were used.Ultrasound guidance: relevant anatomy identified, needle position confirmed, local anesthetic spread visualized around nerve(s)., vascular puncture avoided.  Image printed for medical record.  Negative aspiration and negative test dose prior to incremental administration of local anesthetic. The patient tolerated the procedure well. Vitals signes recorded in RN notes.

## 2016-07-07 NOTE — Transfer of Care (Signed)
Immediate Anesthesia Transfer of Care Note  Patient: Diane Weiss  Procedure(s) Performed: Procedure(s): OPEN CARPAL TUNNEL RELEASE (Right)  Patient Location: PACU  Anesthesia Type: MAC, Regional  Level of Consciousness: awake, alert  and patient cooperative  Airway and Oxygen Therapy: Patient Spontanous Breathing and Patient connected to supplemental oxygen  Post-op Assessment: Post-op Vital signs reviewed, Patient's Cardiovascular Status Stable, Respiratory Function Stable, Patent Airway and No signs of Nausea or vomiting  Post-op Vital Signs: Reviewed and stable  Complications: No apparent anesthesia complications

## 2016-07-07 NOTE — H&P (Signed)
  H and P reviewed. No changes. Uploaded at later date. 

## 2016-07-07 NOTE — Anesthesia Preprocedure Evaluation (Signed)
Anesthesia Evaluation  Patient identified by MRN, date of birth, ID band Patient awake    Reviewed: Allergy & Precautions, H&P , NPO status , Patient's Chart, lab work & pertinent test results, reviewed documented beta blocker date and time   Airway Mallampati: II  TM Distance: >3 FB Neck ROM: full    Dental no notable dental hx.    Pulmonary former smoker,    Pulmonary exam normal breath sounds clear to auscultation       Cardiovascular Exercise Tolerance: Good negative cardio ROS   Rhythm:regular Rate:Normal     Neuro/Psych negative neurological ROS  negative psych ROS   GI/Hepatic negative GI ROS, Neg liver ROS,   Endo/Other  Hypothyroidism   Renal/GU negative Renal ROS  negative genitourinary   Musculoskeletal   Abdominal   Peds  Hematology negative hematology ROS (+)   Anesthesia Other Findings   Reproductive/Obstetrics negative OB ROS                             Anesthesia Physical Anesthesia Plan  ASA: II  Anesthesia Plan: MAC and Regional   Post-op Pain Management:    Induction:   Airway Management Planned:   Additional Equipment:   Intra-op Plan:   Post-operative Plan:   Informed Consent: I have reviewed the patients History and Physical, chart, labs and discussed the procedure including the risks, benefits and alternatives for the proposed anesthesia with the patient or authorized representative who has indicated his/her understanding and acceptance.     Plan Discussed with: CRNA  Anesthesia Plan Comments:         Anesthesia Quick Evaluation

## 2016-07-07 NOTE — Anesthesia Postprocedure Evaluation (Signed)
Anesthesia Post Note  Patient: Diane Weiss  Procedure(s) Performed: Procedure(s) (LRB): OPEN CARPAL TUNNEL RELEASE (Right)  Patient location during evaluation: PACU Anesthesia Type: MAC Level of consciousness: awake and alert Pain management: pain level controlled Vital Signs Assessment: post-procedure vital signs reviewed and stable Respiratory status: spontaneous breathing, nonlabored ventilation and respiratory function stable Cardiovascular status: stable and blood pressure returned to baseline Anesthetic complications: no    DANIEL D KOVACS

## 2016-07-07 NOTE — Anesthesia Procedure Notes (Signed)
Procedure Name: MAC Performed by: Gesselle Fitzsimons Pre-anesthesia Checklist: Patient identified, Emergency Drugs available, Suction available, Patient being monitored and Timeout performed Patient Re-evaluated:Patient Re-evaluated prior to inductionOxygen Delivery Method: Nasal cannula Placement Confirmation: positive ETCO2 and breath sounds checked- equal and bilateral     

## 2016-07-10 ENCOUNTER — Encounter: Payer: Self-pay | Admitting: Unknown Physician Specialty

## 2016-07-27 DIAGNOSIS — Z79899 Other long term (current) drug therapy: Secondary | ICD-10-CM | POA: Diagnosis not present

## 2016-07-27 DIAGNOSIS — M546 Pain in thoracic spine: Secondary | ICD-10-CM | POA: Diagnosis not present

## 2016-07-27 DIAGNOSIS — G894 Chronic pain syndrome: Secondary | ICD-10-CM | POA: Diagnosis not present

## 2016-07-27 DIAGNOSIS — Z79891 Long term (current) use of opiate analgesic: Secondary | ICD-10-CM | POA: Diagnosis not present

## 2016-10-18 DIAGNOSIS — Z131 Encounter for screening for diabetes mellitus: Secondary | ICD-10-CM | POA: Diagnosis not present

## 2016-10-18 DIAGNOSIS — Z1321 Encounter for screening for nutritional disorder: Secondary | ICD-10-CM | POA: Diagnosis not present

## 2016-10-18 DIAGNOSIS — Z0000 Encounter for general adult medical examination without abnormal findings: Secondary | ICD-10-CM | POA: Diagnosis not present

## 2016-10-18 DIAGNOSIS — Z1239 Encounter for other screening for malignant neoplasm of breast: Secondary | ICD-10-CM | POA: Diagnosis not present

## 2016-10-18 DIAGNOSIS — E785 Hyperlipidemia, unspecified: Secondary | ICD-10-CM | POA: Diagnosis not present

## 2016-10-18 DIAGNOSIS — R7303 Prediabetes: Secondary | ICD-10-CM | POA: Diagnosis not present

## 2016-10-18 DIAGNOSIS — E039 Hypothyroidism, unspecified: Secondary | ICD-10-CM | POA: Diagnosis not present

## 2016-10-23 ENCOUNTER — Other Ambulatory Visit: Payer: Self-pay | Admitting: Obstetrics and Gynecology

## 2016-10-23 DIAGNOSIS — Z1231 Encounter for screening mammogram for malignant neoplasm of breast: Secondary | ICD-10-CM

## 2016-10-26 DIAGNOSIS — M546 Pain in thoracic spine: Secondary | ICD-10-CM | POA: Diagnosis not present

## 2016-10-26 DIAGNOSIS — G894 Chronic pain syndrome: Secondary | ICD-10-CM | POA: Diagnosis not present

## 2016-11-13 DIAGNOSIS — E039 Hypothyroidism, unspecified: Secondary | ICD-10-CM | POA: Diagnosis not present

## 2016-11-13 DIAGNOSIS — E785 Hyperlipidemia, unspecified: Secondary | ICD-10-CM | POA: Diagnosis not present

## 2016-11-13 DIAGNOSIS — R079 Chest pain, unspecified: Secondary | ICD-10-CM | POA: Diagnosis not present

## 2016-11-13 DIAGNOSIS — R0602 Shortness of breath: Secondary | ICD-10-CM | POA: Diagnosis not present

## 2016-11-16 ENCOUNTER — Encounter: Payer: Self-pay | Admitting: Obstetrics and Gynecology

## 2016-11-16 ENCOUNTER — Ambulatory Visit
Admission: RE | Admit: 2016-11-16 | Discharge: 2016-11-16 | Disposition: A | Discharge status: Home / Self Care | Payer: PPO | Source: Ambulatory Visit | Attending: Obstetrics and Gynecology | Admitting: Obstetrics and Gynecology

## 2016-11-16 DIAGNOSIS — Z1231 Encounter for screening mammogram for malignant neoplasm of breast: Secondary | ICD-10-CM | POA: Diagnosis not present

## 2016-12-05 DIAGNOSIS — R0602 Shortness of breath: Secondary | ICD-10-CM | POA: Diagnosis not present

## 2016-12-28 DIAGNOSIS — M546 Pain in thoracic spine: Secondary | ICD-10-CM | POA: Diagnosis not present

## 2016-12-28 DIAGNOSIS — G894 Chronic pain syndrome: Secondary | ICD-10-CM | POA: Diagnosis not present

## 2017-02-02 DIAGNOSIS — R300 Dysuria: Secondary | ICD-10-CM | POA: Diagnosis not present

## 2017-02-02 DIAGNOSIS — N390 Urinary tract infection, site not specified: Secondary | ICD-10-CM | POA: Diagnosis not present

## 2017-03-28 DIAGNOSIS — S29012A Strain of muscle and tendon of back wall of thorax, initial encounter: Secondary | ICD-10-CM | POA: Diagnosis not present

## 2017-03-28 DIAGNOSIS — M9902 Segmental and somatic dysfunction of thoracic region: Secondary | ICD-10-CM | POA: Diagnosis not present

## 2017-04-02 DIAGNOSIS — Z79891 Long term (current) use of opiate analgesic: Secondary | ICD-10-CM | POA: Diagnosis not present

## 2017-04-02 DIAGNOSIS — G894 Chronic pain syndrome: Secondary | ICD-10-CM | POA: Diagnosis not present

## 2017-04-02 DIAGNOSIS — M791 Myalgia: Secondary | ICD-10-CM | POA: Diagnosis not present

## 2017-04-02 DIAGNOSIS — M546 Pain in thoracic spine: Secondary | ICD-10-CM | POA: Diagnosis not present

## 2017-04-02 DIAGNOSIS — Z79899 Other long term (current) drug therapy: Secondary | ICD-10-CM | POA: Diagnosis not present

## 2017-04-04 DIAGNOSIS — S29012A Strain of muscle and tendon of back wall of thorax, initial encounter: Secondary | ICD-10-CM | POA: Diagnosis not present

## 2017-04-04 DIAGNOSIS — M9902 Segmental and somatic dysfunction of thoracic region: Secondary | ICD-10-CM | POA: Diagnosis not present

## 2017-05-11 DIAGNOSIS — M9902 Segmental and somatic dysfunction of thoracic region: Secondary | ICD-10-CM | POA: Diagnosis not present

## 2017-05-11 DIAGNOSIS — S29012A Strain of muscle and tendon of back wall of thorax, initial encounter: Secondary | ICD-10-CM | POA: Diagnosis not present

## 2017-05-15 DIAGNOSIS — M7661 Achilles tendinitis, right leg: Secondary | ICD-10-CM | POA: Diagnosis not present

## 2017-05-15 DIAGNOSIS — M7662 Achilles tendinitis, left leg: Secondary | ICD-10-CM | POA: Diagnosis not present

## 2017-05-21 DIAGNOSIS — S29012A Strain of muscle and tendon of back wall of thorax, initial encounter: Secondary | ICD-10-CM | POA: Diagnosis not present

## 2017-05-21 DIAGNOSIS — M9902 Segmental and somatic dysfunction of thoracic region: Secondary | ICD-10-CM | POA: Diagnosis not present

## 2017-05-25 DIAGNOSIS — I10 Essential (primary) hypertension: Secondary | ICD-10-CM | POA: Diagnosis not present

## 2017-05-25 DIAGNOSIS — M791 Myalgia: Secondary | ICD-10-CM | POA: Diagnosis not present

## 2017-05-25 DIAGNOSIS — G894 Chronic pain syndrome: Secondary | ICD-10-CM | POA: Diagnosis not present

## 2017-05-25 DIAGNOSIS — Z6825 Body mass index (BMI) 25.0-25.9, adult: Secondary | ICD-10-CM | POA: Diagnosis not present

## 2017-05-30 DIAGNOSIS — S29012A Strain of muscle and tendon of back wall of thorax, initial encounter: Secondary | ICD-10-CM | POA: Diagnosis not present

## 2017-05-30 DIAGNOSIS — M9902 Segmental and somatic dysfunction of thoracic region: Secondary | ICD-10-CM | POA: Diagnosis not present

## 2017-06-05 ENCOUNTER — Telehealth: Payer: Self-pay

## 2017-06-05 NOTE — Telephone Encounter (Signed)
Since she is medicare, we can only see her now every 2 yrs. She was supposed to get established with PCP and have them manage her meds. I can RF till she gets PCP appt if she hasn't had one yet, but she needs to get established with one ASAP. RN to discuss with pt.

## 2017-06-05 NOTE — Telephone Encounter (Signed)
Pt called triage line stating the pharmacy sent over a refill request on her Lovastatin, it was denied by [place] and she was wanting to know why. CB# [phone]  KJ CMA

## 2017-06-05 NOTE — Telephone Encounter (Signed)
Pt states she is established with a PCP, and will call them for refill. KJ CMA(AAMA)

## 2017-06-06 DIAGNOSIS — M9902 Segmental and somatic dysfunction of thoracic region: Secondary | ICD-10-CM | POA: Diagnosis not present

## 2017-06-06 DIAGNOSIS — S29012A Strain of muscle and tendon of back wall of thorax, initial encounter: Secondary | ICD-10-CM | POA: Diagnosis not present

## 2017-06-15 DIAGNOSIS — M9902 Segmental and somatic dysfunction of thoracic region: Secondary | ICD-10-CM | POA: Diagnosis not present

## 2017-06-15 DIAGNOSIS — S29012A Strain of muscle and tendon of back wall of thorax, initial encounter: Secondary | ICD-10-CM | POA: Diagnosis not present

## 2017-07-11 DIAGNOSIS — M9902 Segmental and somatic dysfunction of thoracic region: Secondary | ICD-10-CM | POA: Diagnosis not present

## 2017-07-11 DIAGNOSIS — M9903 Segmental and somatic dysfunction of lumbar region: Secondary | ICD-10-CM | POA: Diagnosis not present

## 2017-07-11 DIAGNOSIS — M5384 Other specified dorsopathies, thoracic region: Secondary | ICD-10-CM | POA: Diagnosis not present

## 2017-07-11 DIAGNOSIS — M5386 Other specified dorsopathies, lumbar region: Secondary | ICD-10-CM | POA: Diagnosis not present

## 2017-07-12 ENCOUNTER — Other Ambulatory Visit: Payer: Self-pay | Admitting: Obstetrics and Gynecology

## 2017-07-20 DIAGNOSIS — M5384 Other specified dorsopathies, thoracic region: Secondary | ICD-10-CM | POA: Diagnosis not present

## 2017-07-20 DIAGNOSIS — M9901 Segmental and somatic dysfunction of cervical region: Secondary | ICD-10-CM | POA: Diagnosis not present

## 2017-07-20 DIAGNOSIS — M531 Cervicobrachial syndrome: Secondary | ICD-10-CM | POA: Diagnosis not present

## 2017-07-20 DIAGNOSIS — M9902 Segmental and somatic dysfunction of thoracic region: Secondary | ICD-10-CM | POA: Diagnosis not present

## 2017-08-07 DIAGNOSIS — M4803 Spinal stenosis, cervicothoracic region: Secondary | ICD-10-CM | POA: Diagnosis not present

## 2017-08-07 DIAGNOSIS — M5386 Other specified dorsopathies, lumbar region: Secondary | ICD-10-CM | POA: Diagnosis not present

## 2017-08-07 DIAGNOSIS — M9901 Segmental and somatic dysfunction of cervical region: Secondary | ICD-10-CM | POA: Diagnosis not present

## 2017-08-07 DIAGNOSIS — M9903 Segmental and somatic dysfunction of lumbar region: Secondary | ICD-10-CM | POA: Diagnosis not present

## 2017-08-14 DIAGNOSIS — M546 Pain in thoracic spine: Secondary | ICD-10-CM | POA: Diagnosis not present

## 2017-08-14 DIAGNOSIS — M9902 Segmental and somatic dysfunction of thoracic region: Secondary | ICD-10-CM | POA: Diagnosis not present

## 2017-09-06 DIAGNOSIS — J988 Other specified respiratory disorders: Secondary | ICD-10-CM | POA: Diagnosis not present

## 2017-09-11 DIAGNOSIS — R0781 Pleurodynia: Secondary | ICD-10-CM | POA: Diagnosis not present

## 2017-09-11 DIAGNOSIS — M940 Chondrocostal junction syndrome [Tietze]: Secondary | ICD-10-CM | POA: Diagnosis not present

## 2017-09-24 DIAGNOSIS — G894 Chronic pain syndrome: Secondary | ICD-10-CM | POA: Diagnosis not present

## 2017-09-24 DIAGNOSIS — M546 Pain in thoracic spine: Secondary | ICD-10-CM | POA: Diagnosis not present

## 2017-10-01 DIAGNOSIS — M5135 Other intervertebral disc degeneration, thoracolumbar region: Secondary | ICD-10-CM | POA: Diagnosis not present

## 2017-10-01 DIAGNOSIS — M9902 Segmental and somatic dysfunction of thoracic region: Secondary | ICD-10-CM | POA: Diagnosis not present

## 2017-10-01 DIAGNOSIS — M546 Pain in thoracic spine: Secondary | ICD-10-CM | POA: Diagnosis not present

## 2017-10-03 ENCOUNTER — Other Ambulatory Visit: Payer: Self-pay | Admitting: Family Medicine

## 2017-10-03 DIAGNOSIS — Z1231 Encounter for screening mammogram for malignant neoplasm of breast: Secondary | ICD-10-CM

## 2017-10-08 DIAGNOSIS — M546 Pain in thoracic spine: Secondary | ICD-10-CM | POA: Diagnosis not present

## 2017-10-08 DIAGNOSIS — M9902 Segmental and somatic dysfunction of thoracic region: Secondary | ICD-10-CM | POA: Diagnosis not present

## 2017-10-08 DIAGNOSIS — M5135 Other intervertebral disc degeneration, thoracolumbar region: Secondary | ICD-10-CM | POA: Diagnosis not present

## 2017-11-05 DIAGNOSIS — H0014 Chalazion left upper eyelid: Secondary | ICD-10-CM | POA: Diagnosis not present

## 2017-11-12 DIAGNOSIS — H0014 Chalazion left upper eyelid: Secondary | ICD-10-CM | POA: Diagnosis not present

## 2017-11-13 DIAGNOSIS — E785 Hyperlipidemia, unspecified: Secondary | ICD-10-CM | POA: Diagnosis not present

## 2017-11-13 DIAGNOSIS — E039 Hypothyroidism, unspecified: Secondary | ICD-10-CM | POA: Diagnosis not present

## 2017-11-13 DIAGNOSIS — D649 Anemia, unspecified: Secondary | ICD-10-CM | POA: Diagnosis not present

## 2017-11-13 DIAGNOSIS — Z0000 Encounter for general adult medical examination without abnormal findings: Secondary | ICD-10-CM | POA: Diagnosis not present

## 2017-11-19 ENCOUNTER — Ambulatory Visit
Admission: RE | Admit: 2017-11-19 | Discharge: 2017-11-19 | Disposition: A | Discharge status: Home / Self Care | Payer: PPO | Source: Ambulatory Visit | Attending: Family Medicine | Admitting: Family Medicine

## 2017-11-19 DIAGNOSIS — Z1231 Encounter for screening mammogram for malignant neoplasm of breast: Secondary | ICD-10-CM | POA: Diagnosis not present

## 2017-11-20 DIAGNOSIS — E785 Hyperlipidemia, unspecified: Secondary | ICD-10-CM | POA: Diagnosis not present

## 2017-11-20 DIAGNOSIS — D649 Anemia, unspecified: Secondary | ICD-10-CM | POA: Diagnosis not present

## 2017-11-20 DIAGNOSIS — E039 Hypothyroidism, unspecified: Secondary | ICD-10-CM | POA: Diagnosis not present

## 2017-11-20 DIAGNOSIS — Z0000 Encounter for general adult medical examination without abnormal findings: Secondary | ICD-10-CM | POA: Diagnosis not present

## 2017-11-20 DIAGNOSIS — Z23 Encounter for immunization: Secondary | ICD-10-CM | POA: Diagnosis not present

## 2017-11-23 DIAGNOSIS — M4803 Spinal stenosis, cervicothoracic region: Secondary | ICD-10-CM | POA: Diagnosis not present

## 2017-11-23 DIAGNOSIS — M9902 Segmental and somatic dysfunction of thoracic region: Secondary | ICD-10-CM | POA: Diagnosis not present

## 2017-12-04 DIAGNOSIS — D649 Anemia, unspecified: Secondary | ICD-10-CM | POA: Diagnosis not present

## 2017-12-13 DIAGNOSIS — M79671 Pain in right foot: Secondary | ICD-10-CM | POA: Diagnosis not present

## 2017-12-13 DIAGNOSIS — M7661 Achilles tendinitis, right leg: Secondary | ICD-10-CM | POA: Diagnosis not present

## 2017-12-13 DIAGNOSIS — M7662 Achilles tendinitis, left leg: Secondary | ICD-10-CM | POA: Diagnosis not present

## 2017-12-20 DIAGNOSIS — D649 Anemia, unspecified: Secondary | ICD-10-CM | POA: Diagnosis not present

## 2017-12-20 DIAGNOSIS — E611 Iron deficiency: Secondary | ICD-10-CM | POA: Diagnosis not present

## 2018-01-01 DIAGNOSIS — M9901 Segmental and somatic dysfunction of cervical region: Secondary | ICD-10-CM | POA: Diagnosis not present

## 2018-01-01 DIAGNOSIS — M9908 Segmental and somatic dysfunction of rib cage: Secondary | ICD-10-CM | POA: Diagnosis not present

## 2018-01-01 DIAGNOSIS — M9902 Segmental and somatic dysfunction of thoracic region: Secondary | ICD-10-CM | POA: Diagnosis not present

## 2018-01-01 DIAGNOSIS — M531 Cervicobrachial syndrome: Secondary | ICD-10-CM | POA: Diagnosis not present

## 2018-01-07 DIAGNOSIS — M9908 Segmental and somatic dysfunction of rib cage: Secondary | ICD-10-CM | POA: Diagnosis not present

## 2018-01-07 DIAGNOSIS — M9901 Segmental and somatic dysfunction of cervical region: Secondary | ICD-10-CM | POA: Diagnosis not present

## 2018-01-07 DIAGNOSIS — M9902 Segmental and somatic dysfunction of thoracic region: Secondary | ICD-10-CM | POA: Diagnosis not present

## 2018-01-07 DIAGNOSIS — M531 Cervicobrachial syndrome: Secondary | ICD-10-CM | POA: Diagnosis not present

## 2018-01-16 DIAGNOSIS — M9902 Segmental and somatic dysfunction of thoracic region: Secondary | ICD-10-CM | POA: Diagnosis not present

## 2018-01-16 DIAGNOSIS — M9908 Segmental and somatic dysfunction of rib cage: Secondary | ICD-10-CM | POA: Diagnosis not present

## 2018-01-22 DIAGNOSIS — M9902 Segmental and somatic dysfunction of thoracic region: Secondary | ICD-10-CM | POA: Diagnosis not present

## 2018-01-22 DIAGNOSIS — M9908 Segmental and somatic dysfunction of rib cage: Secondary | ICD-10-CM | POA: Diagnosis not present

## 2018-02-07 DIAGNOSIS — Z79899 Other long term (current) drug therapy: Secondary | ICD-10-CM | POA: Diagnosis not present

## 2018-02-07 DIAGNOSIS — G894 Chronic pain syndrome: Secondary | ICD-10-CM | POA: Diagnosis not present

## 2018-02-07 DIAGNOSIS — Z79891 Long term (current) use of opiate analgesic: Secondary | ICD-10-CM | POA: Diagnosis not present

## 2018-02-07 DIAGNOSIS — Z6825 Body mass index (BMI) 25.0-25.9, adult: Secondary | ICD-10-CM | POA: Diagnosis not present

## 2018-02-07 DIAGNOSIS — I10 Essential (primary) hypertension: Secondary | ICD-10-CM | POA: Diagnosis not present

## 2018-03-11 DIAGNOSIS — M25561 Pain in right knee: Secondary | ICD-10-CM | POA: Diagnosis not present

## 2018-03-11 DIAGNOSIS — M25461 Effusion, right knee: Secondary | ICD-10-CM | POA: Diagnosis not present

## 2018-03-11 DIAGNOSIS — M1711 Unilateral primary osteoarthritis, right knee: Secondary | ICD-10-CM | POA: Diagnosis not present

## 2018-03-11 DIAGNOSIS — S83511A Sprain of anterior cruciate ligament of right knee, initial encounter: Secondary | ICD-10-CM | POA: Diagnosis not present

## 2018-05-21 DIAGNOSIS — M9908 Segmental and somatic dysfunction of rib cage: Secondary | ICD-10-CM | POA: Diagnosis not present

## 2018-05-21 DIAGNOSIS — M9902 Segmental and somatic dysfunction of thoracic region: Secondary | ICD-10-CM | POA: Diagnosis not present

## 2018-07-23 DIAGNOSIS — M546 Pain in thoracic spine: Secondary | ICD-10-CM | POA: Diagnosis not present

## 2018-07-23 DIAGNOSIS — Z6825 Body mass index (BMI) 25.0-25.9, adult: Secondary | ICD-10-CM | POA: Diagnosis not present

## 2018-07-23 DIAGNOSIS — G894 Chronic pain syndrome: Secondary | ICD-10-CM | POA: Diagnosis not present

## 2018-07-23 DIAGNOSIS — I10 Essential (primary) hypertension: Secondary | ICD-10-CM | POA: Diagnosis not present

## 2018-09-16 DIAGNOSIS — H25813 Combined forms of age-related cataract, bilateral: Secondary | ICD-10-CM | POA: Diagnosis not present

## 2018-10-09 ENCOUNTER — Other Ambulatory Visit: Payer: Self-pay | Admitting: Family Medicine

## 2018-10-09 DIAGNOSIS — Z1231 Encounter for screening mammogram for malignant neoplasm of breast: Secondary | ICD-10-CM

## 2018-11-20 DIAGNOSIS — E039 Hypothyroidism, unspecified: Secondary | ICD-10-CM | POA: Diagnosis not present

## 2018-11-20 DIAGNOSIS — D649 Anemia, unspecified: Secondary | ICD-10-CM | POA: Diagnosis not present

## 2018-11-20 DIAGNOSIS — E785 Hyperlipidemia, unspecified: Secondary | ICD-10-CM | POA: Diagnosis not present

## 2018-11-20 DIAGNOSIS — Z0000 Encounter for general adult medical examination without abnormal findings: Secondary | ICD-10-CM | POA: Diagnosis not present

## 2018-11-21 ENCOUNTER — Other Ambulatory Visit: Payer: Self-pay

## 2018-11-21 ENCOUNTER — Ambulatory Visit
Admission: RE | Admit: 2018-11-21 | Discharge: 2018-11-21 | Disposition: A | Discharge status: Home / Self Care | Payer: PPO | Source: Ambulatory Visit | Attending: Family Medicine | Admitting: Family Medicine

## 2018-11-21 DIAGNOSIS — Z1231 Encounter for screening mammogram for malignant neoplasm of breast: Secondary | ICD-10-CM

## 2018-11-25 DIAGNOSIS — E039 Hypothyroidism, unspecified: Secondary | ICD-10-CM | POA: Diagnosis not present

## 2018-11-25 DIAGNOSIS — Z0000 Encounter for general adult medical examination without abnormal findings: Secondary | ICD-10-CM | POA: Diagnosis not present

## 2018-11-25 DIAGNOSIS — E785 Hyperlipidemia, unspecified: Secondary | ICD-10-CM | POA: Diagnosis not present

## 2018-11-25 DIAGNOSIS — M79671 Pain in right foot: Secondary | ICD-10-CM | POA: Diagnosis not present

## 2018-11-25 DIAGNOSIS — M810 Age-related osteoporosis without current pathological fracture: Secondary | ICD-10-CM | POA: Diagnosis not present

## 2019-01-14 ENCOUNTER — Ambulatory Visit (INDEPENDENT_AMBULATORY_CARE_PROVIDER_SITE_OTHER): Payer: PPO

## 2019-01-14 ENCOUNTER — Other Ambulatory Visit: Payer: Self-pay

## 2019-01-14 ENCOUNTER — Encounter: Payer: Self-pay | Admitting: Podiatry

## 2019-01-14 ENCOUNTER — Ambulatory Visit: Payer: 59 | Admitting: Podiatry

## 2019-01-14 VITALS — Temp 97.7°F

## 2019-01-14 DIAGNOSIS — M722 Plantar fascial fibromatosis: Secondary | ICD-10-CM

## 2019-01-14 DIAGNOSIS — M7751 Other enthesopathy of right foot: Secondary | ICD-10-CM | POA: Diagnosis not present

## 2019-01-14 MED ORDER — LIDOCAINE 5 % EX PTCH: 1.0000 | MEDICATED_PATCH | CUTANEOUS | 3 refills | Status: DC

## 2019-01-14 MED ORDER — MELOXICAM 15 MG PO TABS: 15.0000 mg | ORAL_TABLET | Freq: Every day | ORAL | 3 refills | Status: AC

## 2019-01-20 NOTE — Progress Notes (Signed)
   HPI: 68 year old female presents the office today for evaluation of right heel pain this is been going on for approximately 2+ years.  Patient says that is painful to put pressure on the back of her heel.  She was given meloxicam approximately 1 year ago and it helped to ease her pain.  Patient states that recently the pain recurred and she was given meloxicam but this second round did not alleviate symptoms.  Physician also prescribed a prednisone pack which did not help.  She presents for further treatment evaluation  Past Medical History:  Diagnosis Date  . History of stomach ulcers   . Hyperlipidemia   . Thyroid disease   . Vertigo    no episodes for several years  . Wears dentures    partial lower     Physical Exam: General: The patient is alert and oriented x3 in no acute distress.  Dermatology: Skin is warm, dry and supple bilateral lower extremities. Negative for open lesions or macerations.  Vascular: Palpable pedal pulses bilaterally. No edema or erythema noted. Capillary refill within normal limits.  Neurological: Epicritic and protective threshold grossly intact bilaterally.   Musculoskeletal Exam: Range of motion within normal limits to all pedal and ankle joints bilateral. Muscle strength 5/5 in all groups bilateral.  Tenderness to palpation noted along the posterior heel.  Radiographic Exam:  Normal osseous mineralization. Joint spaces preserved. No fracture/dislocation/boney destruction.    Assessment: 1.  Posterior heel bursitis right 2.  Pain in right heel   Plan of Care:  1. Patient evaluated. X-Rays reviewed.  2.  Injection of 0.5 cc Celestone Soluspan injected into the right posterior heel 3.  Prescription for meloxicam 15 mg  4.  Night splint dispensed 5.  Return to clinic in 4 weeks      Edrick Kins, DPM [facility]  Dr. Edrick Kins, DPM    [address], [place] [address]                 Office [phone]  Fax [phone]

## 2019-01-21 DIAGNOSIS — M546 Pain in thoracic spine: Secondary | ICD-10-CM | POA: Diagnosis not present

## 2019-01-21 DIAGNOSIS — G894 Chronic pain syndrome: Secondary | ICD-10-CM | POA: Diagnosis not present

## 2019-01-21 DIAGNOSIS — Z79899 Other long term (current) drug therapy: Secondary | ICD-10-CM | POA: Diagnosis not present

## 2019-01-21 DIAGNOSIS — Z79891 Long term (current) use of opiate analgesic: Secondary | ICD-10-CM | POA: Diagnosis not present

## 2019-02-11 ENCOUNTER — Ambulatory Visit: Payer: PPO | Admitting: Podiatry

## 2019-02-11 ENCOUNTER — Encounter: Payer: Self-pay | Admitting: Podiatry

## 2019-02-11 ENCOUNTER — Other Ambulatory Visit: Payer: Self-pay

## 2019-02-11 VITALS — Temp 98.4°F

## 2019-02-11 DIAGNOSIS — M7751 Other enthesopathy of right foot: Secondary | ICD-10-CM

## 2019-02-11 DIAGNOSIS — G5791 Unspecified mononeuropathy of right lower limb: Secondary | ICD-10-CM | POA: Diagnosis not present

## 2019-02-11 MED ORDER — GABAPENTIN 100 MG PO CAPS: 100.0000 mg | ORAL_CAPSULE | Freq: Every day | ORAL | 3 refills | Status: DC

## 2019-02-13 NOTE — Progress Notes (Signed)
   HPI: 68 year old female presents the office today for follow up evaluation of right heel pain. She states the pain has improved somewhat and is only present at night when applying pressure to the heel. She has been using the night splint and taking Gabapentin as directed. Patient is here for further evaluation and treatment.   Past Medical History:  Diagnosis Date  . History of stomach ulcers   . Hyperlipidemia   . Thyroid disease   . Vertigo    no episodes for several years  . Wears dentures    partial lower     Physical Exam: General: The patient is alert and oriented x3 in no acute distress.  Dermatology: Skin is warm, dry and supple bilateral lower extremities. Negative for open lesions or macerations.  Vascular: Palpable pedal pulses bilaterally. No edema or erythema noted. Capillary refill within normal limits.  Neurological: Epicritic and protective threshold grossly intact bilaterally.   Musculoskeletal Exam: Range of motion within normal limits to all pedal and ankle joints bilateral. Muscle strength 5/5 in all groups bilateral.  Tenderness to palpation noted along the posterior heel.  Assessment: 1. Posterior heel bursitis right - nocturnal only 2. Pain in right heel - nocturnal only    Plan of Care:  1. Patient evaluated.  2. Prescription for Gabapentin 100 mg provided to patient.  3. Continue using night splint.  4. Return to clinic as needed.      Edrick Kins, DPM [facility]  Dr. Edrick Kins, DPM    [address], [place] [address]                Office [phone]  Fax [phone]

## 2019-03-05 ENCOUNTER — Other Ambulatory Visit: Payer: Self-pay | Admitting: Podiatry

## 2019-06-09 DIAGNOSIS — G894 Chronic pain syndrome: Secondary | ICD-10-CM | POA: Diagnosis not present

## 2019-06-09 DIAGNOSIS — M7918 Myalgia, other site: Secondary | ICD-10-CM | POA: Diagnosis not present

## 2019-06-09 DIAGNOSIS — M546 Pain in thoracic spine: Secondary | ICD-10-CM | POA: Diagnosis not present

## 2019-06-16 DIAGNOSIS — M9902 Segmental and somatic dysfunction of thoracic region: Secondary | ICD-10-CM | POA: Diagnosis not present

## 2019-06-16 DIAGNOSIS — M4005 Postural kyphosis, thoracolumbar region: Secondary | ICD-10-CM | POA: Diagnosis not present

## 2019-06-30 DIAGNOSIS — M9903 Segmental and somatic dysfunction of lumbar region: Secondary | ICD-10-CM | POA: Diagnosis not present

## 2019-06-30 DIAGNOSIS — M5386 Other specified dorsopathies, lumbar region: Secondary | ICD-10-CM | POA: Diagnosis not present

## 2019-06-30 DIAGNOSIS — M9901 Segmental and somatic dysfunction of cervical region: Secondary | ICD-10-CM | POA: Diagnosis not present

## 2019-06-30 DIAGNOSIS — M531 Cervicobrachial syndrome: Secondary | ICD-10-CM | POA: Diagnosis not present

## 2019-07-07 DIAGNOSIS — M5386 Other specified dorsopathies, lumbar region: Secondary | ICD-10-CM | POA: Diagnosis not present

## 2019-07-07 DIAGNOSIS — M9901 Segmental and somatic dysfunction of cervical region: Secondary | ICD-10-CM | POA: Diagnosis not present

## 2019-07-07 DIAGNOSIS — M531 Cervicobrachial syndrome: Secondary | ICD-10-CM | POA: Diagnosis not present

## 2019-07-07 DIAGNOSIS — M9903 Segmental and somatic dysfunction of lumbar region: Secondary | ICD-10-CM | POA: Diagnosis not present

## 2019-07-22 DIAGNOSIS — M7918 Myalgia, other site: Secondary | ICD-10-CM | POA: Diagnosis not present

## 2019-07-22 DIAGNOSIS — Z79891 Long term (current) use of opiate analgesic: Secondary | ICD-10-CM | POA: Diagnosis not present

## 2019-07-22 DIAGNOSIS — M546 Pain in thoracic spine: Secondary | ICD-10-CM | POA: Diagnosis not present

## 2019-07-22 DIAGNOSIS — G894 Chronic pain syndrome: Secondary | ICD-10-CM | POA: Diagnosis not present

## 2019-07-22 DIAGNOSIS — Z79899 Other long term (current) drug therapy: Secondary | ICD-10-CM | POA: Diagnosis not present

## 2019-09-26 DIAGNOSIS — M4014 Other secondary kyphosis, thoracic region: Secondary | ICD-10-CM | POA: Diagnosis not present

## 2019-09-26 DIAGNOSIS — M9902 Segmental and somatic dysfunction of thoracic region: Secondary | ICD-10-CM | POA: Diagnosis not present

## 2019-09-26 DIAGNOSIS — M546 Pain in thoracic spine: Secondary | ICD-10-CM | POA: Diagnosis not present

## 2019-09-26 DIAGNOSIS — M9901 Segmental and somatic dysfunction of cervical region: Secondary | ICD-10-CM | POA: Diagnosis not present

## 2019-10-08 ENCOUNTER — Other Ambulatory Visit: Payer: Self-pay | Admitting: Family Medicine

## 2019-10-08 DIAGNOSIS — M4014 Other secondary kyphosis, thoracic region: Secondary | ICD-10-CM | POA: Diagnosis not present

## 2019-10-08 DIAGNOSIS — M546 Pain in thoracic spine: Secondary | ICD-10-CM | POA: Diagnosis not present

## 2019-10-08 DIAGNOSIS — M9902 Segmental and somatic dysfunction of thoracic region: Secondary | ICD-10-CM | POA: Diagnosis not present

## 2019-10-08 DIAGNOSIS — M9901 Segmental and somatic dysfunction of cervical region: Secondary | ICD-10-CM | POA: Diagnosis not present

## 2019-10-08 DIAGNOSIS — Z1231 Encounter for screening mammogram for malignant neoplasm of breast: Secondary | ICD-10-CM

## 2019-10-29 DIAGNOSIS — M546 Pain in thoracic spine: Secondary | ICD-10-CM | POA: Diagnosis not present

## 2019-10-29 DIAGNOSIS — M9902 Segmental and somatic dysfunction of thoracic region: Secondary | ICD-10-CM | POA: Diagnosis not present

## 2019-10-29 DIAGNOSIS — M9901 Segmental and somatic dysfunction of cervical region: Secondary | ICD-10-CM | POA: Diagnosis not present

## 2019-10-29 DIAGNOSIS — M4014 Other secondary kyphosis, thoracic region: Secondary | ICD-10-CM | POA: Diagnosis not present

## 2019-11-20 DIAGNOSIS — E039 Hypothyroidism, unspecified: Secondary | ICD-10-CM | POA: Diagnosis not present

## 2019-11-20 DIAGNOSIS — E785 Hyperlipidemia, unspecified: Secondary | ICD-10-CM | POA: Diagnosis not present

## 2019-11-20 DIAGNOSIS — Z0000 Encounter for general adult medical examination without abnormal findings: Secondary | ICD-10-CM | POA: Diagnosis not present

## 2019-11-25 ENCOUNTER — Ambulatory Visit
Admission: RE | Admit: 2019-11-25 | Discharge: 2019-11-25 | Disposition: A | Discharge status: Home / Self Care | Payer: PPO | Source: Ambulatory Visit | Attending: Family Medicine | Admitting: Family Medicine

## 2019-11-25 DIAGNOSIS — Z1231 Encounter for screening mammogram for malignant neoplasm of breast: Secondary | ICD-10-CM | POA: Diagnosis not present

## 2019-12-01 DIAGNOSIS — Z23 Encounter for immunization: Secondary | ICD-10-CM | POA: Diagnosis not present

## 2019-12-01 DIAGNOSIS — E785 Hyperlipidemia, unspecified: Secondary | ICD-10-CM | POA: Diagnosis not present

## 2019-12-01 DIAGNOSIS — M810 Age-related osteoporosis without current pathological fracture: Secondary | ICD-10-CM | POA: Diagnosis not present

## 2019-12-01 DIAGNOSIS — Z0000 Encounter for general adult medical examination without abnormal findings: Secondary | ICD-10-CM | POA: Diagnosis not present

## 2019-12-01 DIAGNOSIS — E039 Hypothyroidism, unspecified: Secondary | ICD-10-CM | POA: Diagnosis not present

## 2019-12-03 DIAGNOSIS — M8588 Other specified disorders of bone density and structure, other site: Secondary | ICD-10-CM | POA: Diagnosis not present

## 2019-12-19 ENCOUNTER — Ambulatory Visit: Payer: PPO | Attending: Internal Medicine

## 2019-12-19 DIAGNOSIS — Z23 Encounter for immunization: Secondary | ICD-10-CM

## 2019-12-19 NOTE — Progress Notes (Signed)
   Covid-19 Vaccination Clinic  Name:  Diane Weiss    MRN: [id number] DOB: 20-Nov-1950  12/19/2019  [name] was observed post Covid-19 immunization for 15 minutes without incident. She was provided with Vaccine Information Sheet and instruction to access the V-Safe system.   [name] was instructed to call 911 with any severe reactions post vaccine: Marland Kitchen Difficulty breathing  . Swelling of face and throat  . A fast heartbeat  . A bad rash all over body  . Dizziness and weakness   Immunizations Administered    Name Date Dose VIS Date Route   Pfizer COVID-19 Vaccine 12/19/2019 10:11 AM 0.3 mL 08/22/2019 Intramuscular   Manufacturer: [place]   Lot: [id number]   [place]: [id number]

## 2020-01-14 ENCOUNTER — Ambulatory Visit: Payer: PPO

## 2020-01-21 ENCOUNTER — Ambulatory Visit: Payer: PPO | Attending: Internal Medicine

## 2020-01-21 DIAGNOSIS — M7918 Myalgia, other site: Secondary | ICD-10-CM | POA: Diagnosis not present

## 2020-01-21 DIAGNOSIS — Z23 Encounter for immunization: Secondary | ICD-10-CM

## 2020-01-21 DIAGNOSIS — Z79891 Long term (current) use of opiate analgesic: Secondary | ICD-10-CM | POA: Diagnosis not present

## 2020-01-21 DIAGNOSIS — Z79899 Other long term (current) drug therapy: Secondary | ICD-10-CM | POA: Diagnosis not present

## 2020-01-21 DIAGNOSIS — G894 Chronic pain syndrome: Secondary | ICD-10-CM | POA: Diagnosis not present

## 2020-01-21 DIAGNOSIS — M546 Pain in thoracic spine: Secondary | ICD-10-CM | POA: Diagnosis not present

## 2020-01-21 DIAGNOSIS — Z6825 Body mass index (BMI) 25.0-25.9, adult: Secondary | ICD-10-CM | POA: Diagnosis not present

## 2020-01-21 NOTE — Progress Notes (Signed)
   Covid-19 Vaccination Clinic  Name:  [name]    MRN: [id number] DOB: [birth year]  01/21/2020  [name] was observed post Covid-19 immunization for 15 minutes without incident. She was provided with Vaccine Information Sheet and instruction to access the V-Safe system.   [name] was instructed to call 911 with any severe reactions post vaccine: Marland Kitchen Difficulty breathing  . Swelling of face and throat  . A fast heartbeat  . A bad rash all over body  . Dizziness and weakness   Immunizations Administered    Name Date Dose VIS Date Route   Pfizer COVID-19 Vaccine 01/21/2020  4:20 PM 0.3 mL 11/05/2018 Intramuscular   Manufacturer: [place]   Lot: [id number]   [place]: [id number]

## 2020-03-22 DIAGNOSIS — M9902 Segmental and somatic dysfunction of thoracic region: Secondary | ICD-10-CM | POA: Diagnosis not present

## 2020-03-22 DIAGNOSIS — M9901 Segmental and somatic dysfunction of cervical region: Secondary | ICD-10-CM | POA: Diagnosis not present

## 2020-03-22 DIAGNOSIS — M546 Pain in thoracic spine: Secondary | ICD-10-CM | POA: Diagnosis not present

## 2020-03-22 DIAGNOSIS — M542 Cervicalgia: Secondary | ICD-10-CM | POA: Diagnosis not present

## 2020-03-31 DIAGNOSIS — M9902 Segmental and somatic dysfunction of thoracic region: Secondary | ICD-10-CM | POA: Diagnosis not present

## 2020-03-31 DIAGNOSIS — M542 Cervicalgia: Secondary | ICD-10-CM | POA: Diagnosis not present

## 2020-03-31 DIAGNOSIS — M9901 Segmental and somatic dysfunction of cervical region: Secondary | ICD-10-CM | POA: Diagnosis not present

## 2020-03-31 DIAGNOSIS — M546 Pain in thoracic spine: Secondary | ICD-10-CM | POA: Diagnosis not present

## 2020-04-05 DIAGNOSIS — M546 Pain in thoracic spine: Secondary | ICD-10-CM | POA: Diagnosis not present

## 2020-04-05 DIAGNOSIS — M9902 Segmental and somatic dysfunction of thoracic region: Secondary | ICD-10-CM | POA: Diagnosis not present

## 2020-04-05 DIAGNOSIS — M9901 Segmental and somatic dysfunction of cervical region: Secondary | ICD-10-CM | POA: Diagnosis not present

## 2020-04-05 DIAGNOSIS — M542 Cervicalgia: Secondary | ICD-10-CM | POA: Diagnosis not present

## 2020-04-07 DIAGNOSIS — M9902 Segmental and somatic dysfunction of thoracic region: Secondary | ICD-10-CM | POA: Diagnosis not present

## 2020-04-07 DIAGNOSIS — M542 Cervicalgia: Secondary | ICD-10-CM | POA: Diagnosis not present

## 2020-04-07 DIAGNOSIS — M546 Pain in thoracic spine: Secondary | ICD-10-CM | POA: Diagnosis not present

## 2020-04-07 DIAGNOSIS — M9901 Segmental and somatic dysfunction of cervical region: Secondary | ICD-10-CM | POA: Diagnosis not present

## 2020-04-12 DIAGNOSIS — M9901 Segmental and somatic dysfunction of cervical region: Secondary | ICD-10-CM | POA: Diagnosis not present

## 2020-04-12 DIAGNOSIS — M9902 Segmental and somatic dysfunction of thoracic region: Secondary | ICD-10-CM | POA: Diagnosis not present

## 2020-04-12 DIAGNOSIS — M546 Pain in thoracic spine: Secondary | ICD-10-CM | POA: Diagnosis not present

## 2020-04-12 DIAGNOSIS — M542 Cervicalgia: Secondary | ICD-10-CM | POA: Diagnosis not present

## 2020-04-19 DIAGNOSIS — M9901 Segmental and somatic dysfunction of cervical region: Secondary | ICD-10-CM | POA: Diagnosis not present

## 2020-04-19 DIAGNOSIS — M546 Pain in thoracic spine: Secondary | ICD-10-CM | POA: Diagnosis not present

## 2020-04-19 DIAGNOSIS — M542 Cervicalgia: Secondary | ICD-10-CM | POA: Diagnosis not present

## 2020-04-19 DIAGNOSIS — M9902 Segmental and somatic dysfunction of thoracic region: Secondary | ICD-10-CM | POA: Diagnosis not present

## 2020-04-19 DIAGNOSIS — H25813 Combined forms of age-related cataract, bilateral: Secondary | ICD-10-CM | POA: Diagnosis not present

## 2020-04-26 DIAGNOSIS — M542 Cervicalgia: Secondary | ICD-10-CM | POA: Diagnosis not present

## 2020-04-26 DIAGNOSIS — M9902 Segmental and somatic dysfunction of thoracic region: Secondary | ICD-10-CM | POA: Diagnosis not present

## 2020-04-26 DIAGNOSIS — M9901 Segmental and somatic dysfunction of cervical region: Secondary | ICD-10-CM | POA: Diagnosis not present

## 2020-04-26 DIAGNOSIS — M546 Pain in thoracic spine: Secondary | ICD-10-CM | POA: Diagnosis not present

## 2020-05-10 DIAGNOSIS — M9901 Segmental and somatic dysfunction of cervical region: Secondary | ICD-10-CM | POA: Diagnosis not present

## 2020-05-10 DIAGNOSIS — M546 Pain in thoracic spine: Secondary | ICD-10-CM | POA: Diagnosis not present

## 2020-05-10 DIAGNOSIS — M9902 Segmental and somatic dysfunction of thoracic region: Secondary | ICD-10-CM | POA: Diagnosis not present

## 2020-05-10 DIAGNOSIS — M542 Cervicalgia: Secondary | ICD-10-CM | POA: Diagnosis not present

## 2020-05-31 DIAGNOSIS — M9901 Segmental and somatic dysfunction of cervical region: Secondary | ICD-10-CM | POA: Diagnosis not present

## 2020-05-31 DIAGNOSIS — M546 Pain in thoracic spine: Secondary | ICD-10-CM | POA: Diagnosis not present

## 2020-05-31 DIAGNOSIS — M542 Cervicalgia: Secondary | ICD-10-CM | POA: Diagnosis not present

## 2020-05-31 DIAGNOSIS — M9902 Segmental and somatic dysfunction of thoracic region: Secondary | ICD-10-CM | POA: Diagnosis not present

## 2020-06-11 ENCOUNTER — Other Ambulatory Visit: Payer: Self-pay

## 2020-06-11 ENCOUNTER — Ambulatory Visit (INDEPENDENT_AMBULATORY_CARE_PROVIDER_SITE_OTHER): Payer: PPO

## 2020-06-11 ENCOUNTER — Ambulatory Visit (INDEPENDENT_AMBULATORY_CARE_PROVIDER_SITE_OTHER): Payer: PPO | Admitting: Podiatry

## 2020-06-11 DIAGNOSIS — M722 Plantar fascial fibromatosis: Secondary | ICD-10-CM

## 2020-06-11 MED ORDER — METHYLPREDNISOLONE 4 MG PO TBPK: ORAL_TABLET | ORAL | 0 refills | Status: DC

## 2020-06-11 MED ORDER — MELOXICAM 15 MG PO TABS: 15.0000 mg | ORAL_TABLET | Freq: Every day | ORAL | 1 refills | Status: DC

## 2020-06-11 NOTE — Progress Notes (Signed)
   Subjective: 69 y.o. female presenting today with a new complaint regarding pain to the left heel that is been going on for about 2 months now. Patient states that she has noticed increased pain with her walking. She likes to go walking at the mall every morning. Currently she is wearing some OTC gel cushions for her heel which provide some minimal relief. She denies a history of injury. She presents for further treatment evaluation   Past Medical History:  Diagnosis Date  . History of stomach ulcers   . Hyperlipidemia   . Thyroid disease   . Vertigo    no episodes for several years  . Wears dentures    partial lower     Objective: Physical Exam General: The patient is alert and oriented x3 in no acute distress.  Dermatology: Skin is warm, dry and supple bilateral lower extremities. Negative for open lesions or macerations bilateral.   Vascular: Dorsalis Pedis and Posterior Tibial pulses palpable bilateral.  Capillary fill time is immediate to all digits.  Neurological: Epicritic and protective threshold intact bilateral.   Musculoskeletal: Tenderness to palpation to the plantar aspect of the left heel along the plantar fascia. All other joints range of motion within normal limits bilateral. Strength 5/5 in all groups bilateral.   Radiographic exam: Normal osseous mineralization. Joint spaces preserved. No fracture/dislocation/boney destruction. No other soft tissue abnormalities or radiopaque foreign bodies. Plantar heel spur noted on lateral view  Assessment: 1. Plantar fasciitis left foot  Plan of Care:  1. Patient evaluated. Xrays reviewed.   2. Injection of 0.5cc Celestone soluspan injected into the left plantar fascia.  3. Rx for Medrol Dose Pak placed 4. Rx for Meloxicam ordered for patient. 5. Continue wearing gel heel cushions 6. Instructed patient regarding therapies and modalities at home to alleviate symptoms.  7. Return to clinic in 4 weeks.     Edrick Kins, DPM [facility]  Dr. Edrick Kins, DPM    [address], [place] [address]                Office [phone]  Fax [phone]

## 2020-06-28 DIAGNOSIS — M546 Pain in thoracic spine: Secondary | ICD-10-CM | POA: Diagnosis not present

## 2020-06-28 DIAGNOSIS — M542 Cervicalgia: Secondary | ICD-10-CM | POA: Diagnosis not present

## 2020-06-28 DIAGNOSIS — R1013 Epigastric pain: Secondary | ICD-10-CM | POA: Diagnosis not present

## 2020-06-28 DIAGNOSIS — M9901 Segmental and somatic dysfunction of cervical region: Secondary | ICD-10-CM | POA: Diagnosis not present

## 2020-06-28 DIAGNOSIS — K5900 Constipation, unspecified: Secondary | ICD-10-CM | POA: Diagnosis not present

## 2020-06-28 DIAGNOSIS — R109 Unspecified abdominal pain: Secondary | ICD-10-CM | POA: Diagnosis not present

## 2020-06-28 DIAGNOSIS — M9902 Segmental and somatic dysfunction of thoracic region: Secondary | ICD-10-CM | POA: Diagnosis not present

## 2020-06-28 DIAGNOSIS — E039 Hypothyroidism, unspecified: Secondary | ICD-10-CM | POA: Diagnosis not present

## 2020-07-16 ENCOUNTER — Ambulatory Visit (INDEPENDENT_AMBULATORY_CARE_PROVIDER_SITE_OTHER): Payer: PPO | Admitting: Podiatry

## 2020-07-16 ENCOUNTER — Other Ambulatory Visit: Payer: Self-pay

## 2020-07-16 ENCOUNTER — Encounter: Payer: Self-pay | Admitting: Podiatry

## 2020-07-16 DIAGNOSIS — M722 Plantar fascial fibromatosis: Secondary | ICD-10-CM | POA: Diagnosis not present

## 2020-07-23 DIAGNOSIS — G894 Chronic pain syndrome: Secondary | ICD-10-CM | POA: Diagnosis not present

## 2020-07-23 DIAGNOSIS — M7918 Myalgia, other site: Secondary | ICD-10-CM | POA: Diagnosis not present

## 2020-07-23 DIAGNOSIS — M546 Pain in thoracic spine: Secondary | ICD-10-CM | POA: Diagnosis not present

## 2020-07-23 DIAGNOSIS — Z79899 Other long term (current) drug therapy: Secondary | ICD-10-CM | POA: Diagnosis not present

## 2020-07-23 DIAGNOSIS — Z79891 Long term (current) use of opiate analgesic: Secondary | ICD-10-CM | POA: Diagnosis not present

## 2020-07-23 NOTE — Progress Notes (Signed)
   Subjective: 69 y.o. female for follow-up evaluation of plantar fasciitis to the left foot. She states that it significantly improved. She continues to have some small amount of pain in the mornings when first standing from a sitting position however overall is significantly better. She presents for further treatment evaluation   Past Medical History:  Diagnosis Date  . History of stomach ulcers   . Hyperlipidemia   . Thyroid disease   . Vertigo    no episodes for several years  . Wears dentures    partial lower     Objective: Physical Exam General: The patient is alert and oriented x3 in no acute distress.  Dermatology: Skin is warm, dry and supple bilateral lower extremities. Negative for open lesions or macerations bilateral.   Vascular: Dorsalis Pedis and Posterior Tibial pulses palpable bilateral.  Capillary fill time is immediate to all digits.  Neurological: Epicritic and protective threshold intact bilateral.   Musculoskeletal: Tenderness to palpation to the plantar aspect of the left heel along the plantar fascia. Overall this is improved since last visit all other joints range of motion within normal limits bilateral. Strength 5/5 in all groups bilateral.    Assessment: 1. Plantar fasciitis left foot-improved  Plan of Care:  1. Patient evaluated.  2. Power step insoles were provided today. Wear daily with new balance shoes 3. Discontinue meloxicam secondary to upset stomach 4. Continue OTC Tylenol arthritis 5. Return to clinic as needed   Edrick Kins, DPM [facility]  Dr. Edrick Kins, DPM    [address], [place] [address]                Office [phone]  Fax [phone]

## 2020-08-02 DIAGNOSIS — M9902 Segmental and somatic dysfunction of thoracic region: Secondary | ICD-10-CM | POA: Diagnosis not present

## 2020-08-02 DIAGNOSIS — M546 Pain in thoracic spine: Secondary | ICD-10-CM | POA: Diagnosis not present

## 2020-08-02 DIAGNOSIS — M542 Cervicalgia: Secondary | ICD-10-CM | POA: Diagnosis not present

## 2020-08-02 DIAGNOSIS — M9901 Segmental and somatic dysfunction of cervical region: Secondary | ICD-10-CM | POA: Diagnosis not present

## 2020-11-12 ENCOUNTER — Other Ambulatory Visit: Payer: Self-pay | Admitting: Family Medicine

## 2020-11-12 DIAGNOSIS — Z1231 Encounter for screening mammogram for malignant neoplasm of breast: Secondary | ICD-10-CM

## 2020-11-24 DIAGNOSIS — E039 Hypothyroidism, unspecified: Secondary | ICD-10-CM | POA: Diagnosis not present

## 2020-11-24 DIAGNOSIS — E785 Hyperlipidemia, unspecified: Secondary | ICD-10-CM | POA: Diagnosis not present

## 2020-12-01 ENCOUNTER — Ambulatory Visit
Admission: RE | Admit: 2020-12-01 | Discharge: 2020-12-01 | Disposition: A | Discharge status: Home / Self Care | Payer: PPO | Source: Ambulatory Visit | Attending: Family Medicine | Admitting: Family Medicine

## 2020-12-01 ENCOUNTER — Other Ambulatory Visit: Payer: Self-pay

## 2020-12-01 DIAGNOSIS — M9901 Segmental and somatic dysfunction of cervical region: Secondary | ICD-10-CM | POA: Diagnosis not present

## 2020-12-01 DIAGNOSIS — Z1231 Encounter for screening mammogram for malignant neoplasm of breast: Secondary | ICD-10-CM | POA: Diagnosis not present

## 2020-12-01 DIAGNOSIS — M542 Cervicalgia: Secondary | ICD-10-CM | POA: Diagnosis not present

## 2020-12-01 DIAGNOSIS — Z0000 Encounter for general adult medical examination without abnormal findings: Secondary | ICD-10-CM | POA: Diagnosis not present

## 2020-12-01 DIAGNOSIS — M546 Pain in thoracic spine: Secondary | ICD-10-CM | POA: Diagnosis not present

## 2020-12-01 DIAGNOSIS — E785 Hyperlipidemia, unspecified: Secondary | ICD-10-CM | POA: Diagnosis not present

## 2020-12-01 DIAGNOSIS — E039 Hypothyroidism, unspecified: Secondary | ICD-10-CM | POA: Diagnosis not present

## 2020-12-01 DIAGNOSIS — M9902 Segmental and somatic dysfunction of thoracic region: Secondary | ICD-10-CM | POA: Diagnosis not present

## 2020-12-08 DIAGNOSIS — M9901 Segmental and somatic dysfunction of cervical region: Secondary | ICD-10-CM | POA: Diagnosis not present

## 2020-12-08 DIAGNOSIS — M546 Pain in thoracic spine: Secondary | ICD-10-CM | POA: Diagnosis not present

## 2020-12-08 DIAGNOSIS — M9902 Segmental and somatic dysfunction of thoracic region: Secondary | ICD-10-CM | POA: Diagnosis not present

## 2020-12-08 DIAGNOSIS — M9903 Segmental and somatic dysfunction of lumbar region: Secondary | ICD-10-CM | POA: Diagnosis not present

## 2020-12-29 DIAGNOSIS — Z8639 Personal history of other endocrine, nutritional and metabolic disease: Secondary | ICD-10-CM | POA: Diagnosis not present

## 2020-12-29 DIAGNOSIS — R5383 Other fatigue: Secondary | ICD-10-CM | POA: Diagnosis not present

## 2020-12-29 DIAGNOSIS — E039 Hypothyroidism, unspecified: Secondary | ICD-10-CM | POA: Diagnosis not present

## 2020-12-29 DIAGNOSIS — R0602 Shortness of breath: Secondary | ICD-10-CM | POA: Diagnosis not present

## 2020-12-29 DIAGNOSIS — R52 Pain, unspecified: Secondary | ICD-10-CM | POA: Diagnosis not present

## 2021-01-04 ENCOUNTER — Emergency Department: Payer: PPO

## 2021-01-04 ENCOUNTER — Observation Stay
Admission: EM | Admit: 2021-01-04 | Discharge: 2021-01-05 | Disposition: A | Discharge status: Home / Self Care | Payer: PPO | Attending: Internal Medicine | Admitting: Internal Medicine

## 2021-01-04 ENCOUNTER — Other Ambulatory Visit: Payer: Self-pay

## 2021-01-04 ENCOUNTER — Encounter: Payer: Self-pay | Admitting: Emergency Medicine

## 2021-01-04 DIAGNOSIS — R5381 Other malaise: Secondary | ICD-10-CM | POA: Diagnosis not present

## 2021-01-04 DIAGNOSIS — R0789 Other chest pain: Secondary | ICD-10-CM | POA: Diagnosis not present

## 2021-01-04 DIAGNOSIS — E785 Hyperlipidemia, unspecified: Secondary | ICD-10-CM | POA: Diagnosis not present

## 2021-01-04 DIAGNOSIS — K449 Diaphragmatic hernia without obstruction or gangrene: Secondary | ICD-10-CM | POA: Diagnosis not present

## 2021-01-04 DIAGNOSIS — Z87891 Personal history of nicotine dependence: Secondary | ICD-10-CM | POA: Insufficient documentation

## 2021-01-04 DIAGNOSIS — K921 Melena: Secondary | ICD-10-CM | POA: Diagnosis not present

## 2021-01-04 DIAGNOSIS — Z79899 Other long term (current) drug therapy: Secondary | ICD-10-CM | POA: Insufficient documentation

## 2021-01-04 DIAGNOSIS — D62 Acute posthemorrhagic anemia: Secondary | ICD-10-CM | POA: Diagnosis present

## 2021-01-04 DIAGNOSIS — K2950 Unspecified chronic gastritis without bleeding: Principal | ICD-10-CM | POA: Insufficient documentation

## 2021-01-04 DIAGNOSIS — K922 Gastrointestinal hemorrhage, unspecified: Secondary | ICD-10-CM

## 2021-01-04 DIAGNOSIS — R5383 Other fatigue: Secondary | ICD-10-CM | POA: Diagnosis not present

## 2021-01-04 DIAGNOSIS — Z980 Intestinal bypass and anastomosis status: Secondary | ICD-10-CM | POA: Insufficient documentation

## 2021-01-04 DIAGNOSIS — R079 Chest pain, unspecified: Secondary | ICD-10-CM | POA: Diagnosis not present

## 2021-01-04 DIAGNOSIS — Z20822 Contact with and (suspected) exposure to covid-19: Secondary | ICD-10-CM | POA: Diagnosis not present

## 2021-01-04 DIAGNOSIS — E039 Hypothyroidism, unspecified: Secondary | ICD-10-CM | POA: Diagnosis not present

## 2021-01-04 DIAGNOSIS — D649 Anemia, unspecified: Secondary | ICD-10-CM | POA: Diagnosis not present

## 2021-01-04 DIAGNOSIS — R0602 Shortness of breath: Secondary | ICD-10-CM | POA: Diagnosis not present

## 2021-01-04 HISTORY — DX: Gastrointestinal hemorrhage, unspecified: K92.2

## 2021-01-04 LAB — CBC
HCT: 21 % — ABNORMAL LOW (ref 36.0–46.0)
HCT: 22.6 % — ABNORMAL LOW (ref 36.0–46.0)
HCT: 26.1 % — ABNORMAL LOW (ref 36.0–46.0)
HCT: 27.2 % — ABNORMAL LOW (ref 36.0–46.0)
Hemoglobin: 6.4 g/dL — ABNORMAL LOW (ref 12.0–15.0)
Hemoglobin: 6.8 g/dL — ABNORMAL LOW (ref 12.0–15.0)
Hemoglobin: 8.1 g/dL — ABNORMAL LOW (ref 12.0–15.0)
Hemoglobin: 8.8 g/dL — ABNORMAL LOW (ref 12.0–15.0)
MCH: 27.2 pg (ref 26.0–34.0)
MCH: 27.4 pg (ref 26.0–34.0)
MCH: 27.4 pg (ref 26.0–34.0)
MCH: 27.3 pg (ref 26.0–34.0)
MCHC: 30.1 g/dL (ref 30.0–36.0)
MCHC: 30.5 g/dL (ref 30.0–36.0)
MCHC: 31 g/dL (ref 30.0–36.0)
MCHC: 32.4 g/dL (ref 30.0–36.0)
MCV: 89.7 fL (ref 80.0–100.0)
MCV: 90.4 fL (ref 80.0–100.0)
MCV: 88.2 fL (ref 80.0–100.0)
MCV: 84.5 fL (ref 80.0–100.0)
Platelets: 359 10*3/uL (ref 150–400)
Platelets: 397 10*3/uL (ref 150–400)
Platelets: 324 10*3/uL (ref 150–400)
Platelets: 290 10*3/uL (ref 150–400)
RBC: 2.34 MIL/uL — ABNORMAL LOW (ref 3.87–5.11)
RBC: 2.5 MIL/uL — ABNORMAL LOW (ref 3.87–5.11)
RBC: 2.96 MIL/uL — ABNORMAL LOW (ref 3.87–5.11)
RBC: 3.22 MIL/uL — ABNORMAL LOW (ref 3.87–5.11)
RDW: 14.8 % (ref 11.5–15.5)
RDW: 15 % (ref 11.5–15.5)
RDW: 15.2 % (ref 11.5–15.5)
RDW: 16.3 % — ABNORMAL HIGH (ref 11.5–15.5)
WBC: 6.9 10*3/uL (ref 4.0–10.5)
WBC: 9.5 10*3/uL (ref 4.0–10.5)
WBC: 6 10*3/uL (ref 4.0–10.5)
WBC: 6.3 10*3/uL (ref 4.0–10.5)
nRBC: 0.3 % — ABNORMAL HIGH (ref 0.0–0.2)
nRBC: 0.3 % — ABNORMAL HIGH (ref 0.0–0.2)
nRBC: 0 % (ref 0.0–0.2)
nRBC: 0 % (ref 0.0–0.2)

## 2021-01-04 LAB — HIV ANTIBODY (ROUTINE TESTING W REFLEX): HIV Screen 4th Generation wRfx: NONREACTIVE

## 2021-01-04 LAB — COMPREHENSIVE METABOLIC PANEL
ALT: 12 U/L (ref 0–44)
AST: 18 U/L (ref 15–41)
Albumin: 3.9 g/dL (ref 3.5–5.0)
Alkaline Phosphatase: 36 U/L — ABNORMAL LOW (ref 38–126)
Anion gap: 8 (ref 5–15)
BUN: 12 mg/dL (ref 8–23)
CO2: 23 mmol/L (ref 22–32)
Calcium: 8.4 mg/dL — ABNORMAL LOW (ref 8.9–10.3)
Chloride: 106 mmol/L (ref 98–111)
Creatinine, Ser: 0.69 mg/dL (ref 0.44–1.00)
GFR, Estimated: >60 mL/min (ref >60)
Glucose, Bld: 114 mg/dL — ABNORMAL HIGH (ref 70–99)
Potassium: 3.6 mmol/L (ref 3.5–5.1)
Sodium: 137 mmol/L (ref 135–145)
Total Bilirubin: 0.6 mg/dL (ref 0.3–1.2)
Total Protein: 6.6 g/dL (ref 6.5–8.1)

## 2021-01-04 LAB — APTT: aPTT: 25 seconds (ref 24–36)

## 2021-01-04 LAB — ABO/RH: ABO/RH(D): A POS

## 2021-01-04 LAB — IRON AND TIBC
Iron: 56 ug/dL (ref 28–170)
Iron: 54 ug/dL (ref 28–170)
Saturation Ratios: 11 % (ref 10.4–31.8)
Saturation Ratios: 10 % — ABNORMAL LOW (ref 10.4–31.8)
TIBC: 518 ug/dL — ABNORMAL HIGH (ref 250–450)
TIBC: 526 ug/dL — ABNORMAL HIGH (ref 250–450)
UIBC: 462 ug/dL
UIBC: 472 ug/dL

## 2021-01-04 LAB — FOLATE: Folate: 28 ng/mL (ref >5.9)

## 2021-01-04 LAB — PROTIME-INR
INR: 1 (ref 0.8–1.2)
Prothrombin Time: 13.6 seconds (ref 11.4–15.2)

## 2021-01-04 LAB — RETICULOCYTES
Immature Retic Fract: 33.5 % — ABNORMAL HIGH (ref 2.3–15.9)
RBC.: 2.4 MIL/uL — ABNORMAL LOW (ref 3.87–5.11)
Retic Count, Absolute: 113.4 10*3/uL (ref 19.0–186.0)
Retic Ct Pct: 4.7 % — ABNORMAL HIGH (ref 0.4–3.1)

## 2021-01-04 LAB — VITAMIN B12: Vitamin B-12: 367 pg/mL (ref 180–914)

## 2021-01-04 LAB — TROPONIN I (HIGH SENSITIVITY)
Troponin I (High Sensitivity): 7 ng/L (ref <18)
Troponin I (High Sensitivity): 7 ng/L (ref <18)

## 2021-01-04 LAB — FERRITIN: Ferritin: 3 ng/mL — ABNORMAL LOW (ref 11–307)

## 2021-01-04 LAB — PREPARE RBC (CROSSMATCH)

## 2021-01-04 LAB — SARS CORONAVIRUS 2 (TAT 6-24 HRS): SARS Coronavirus 2: NEGATIVE

## 2021-01-04 MED ORDER — SODIUM CHLORIDE 0.9 % IV SOLN
10.0000 mL/h | Freq: Once | INTRAVENOUS | Status: AC
  Administered 2021-01-04: 10 mL/h via INTRAVENOUS

## 2021-01-04 MED ORDER — SODIUM CHLORIDE 0.9 % IV SOLN
8.0000 mg/h | INTRAVENOUS | Status: DC
  Administered 2021-01-04: 8 mg/h via INTRAVENOUS
  Filled 2021-01-04 (×2): qty 80

## 2021-01-04 MED ORDER — ACETAMINOPHEN 325 MG PO TABS: 650.0000 mg | ORAL_TABLET | Freq: Four times a day (QID) | ORAL | Status: DC | PRN

## 2021-01-04 MED ORDER — SODIUM CHLORIDE 0.9 % IV SOLN
300.0000 mg | Freq: Once | INTRAVENOUS | Status: DC
  Filled 2021-01-04: qty 15

## 2021-01-04 MED ORDER — SODIUM CHLORIDE 0.9 % IV SOLN
80.0000 mg | Freq: Once | INTRAVENOUS | Status: DC
  Filled 2021-01-04: qty 80

## 2021-01-04 MED ORDER — LEVOTHYROXINE SODIUM 50 MCG PO TABS
75.0000 ug | ORAL_TABLET | Freq: Every day | ORAL | Status: DC
  Filled 2021-01-04: qty 2

## 2021-01-04 MED ORDER — BACLOFEN 10 MG PO TABS
10.0000 mg | ORAL_TABLET | Freq: Every day | ORAL | Status: DC
  Administered 2021-01-04: 10 mg via ORAL
  Filled 2021-01-04 (×2): qty 1

## 2021-01-04 MED ORDER — ONDANSETRON HCL 4 MG/2ML IJ SOLN: 4.0000 mg | Freq: Three times a day (TID) | INTRAMUSCULAR | Status: DC | PRN

## 2021-01-04 MED ORDER — LUTEIN 20 MG PO TABS: 1.0000 | ORAL_TABLET | Freq: Every day | ORAL | Status: DC

## 2021-01-04 MED ORDER — HYDRALAZINE HCL 20 MG/ML IJ SOLN: 5.0000 mg | INTRAMUSCULAR | Status: DC | PRN

## 2021-01-04 MED ORDER — VITAMIN D 25 MCG (1000 UNIT) PO TABS
2000.0000 [IU] | ORAL_TABLET | Freq: Every day | ORAL | Status: DC
  Administered 2021-01-04: 2000 [IU] via ORAL
  Filled 2021-01-04: qty 2

## 2021-01-04 MED ORDER — PRAVASTATIN SODIUM 10 MG PO TABS
40.0000 mg | ORAL_TABLET | Freq: Every day | ORAL | Status: DC
  Administered 2021-01-04: 40 mg via ORAL
  Filled 2021-01-04 (×2): qty 4

## 2021-01-04 MED ORDER — PANTOPRAZOLE SODIUM 40 MG IV SOLR
40.0000 mg | Freq: Once | INTRAVENOUS | Status: DC
  Filled 2021-01-04: qty 40

## 2021-01-04 MED ORDER — TRAMADOL HCL 50 MG PO TABS
50.0000 mg | ORAL_TABLET | Freq: Two times a day (BID) | ORAL | Status: DC
  Administered 2021-01-04: 50 mg via ORAL
  Filled 2021-01-04: qty 1

## 2021-01-04 MED ORDER — MORPHINE SULFATE (PF) 2 MG/ML IV SOLN: 1.0000 mg | INTRAVENOUS | Status: DC | PRN

## 2021-01-04 MED ORDER — SODIUM CHLORIDE 0.9 % IV SOLN: INTRAVENOUS | Status: DC

## 2021-01-04 MED ORDER — PANTOPRAZOLE SODIUM 40 MG IV SOLR: 40.0000 mg | Freq: Two times a day (BID) | INTRAVENOUS | Status: DC

## 2021-01-04 NOTE — ED Provider Notes (Signed)
[facility] Emergency Department Provider Note    Event Date/Time   First MD Initiated Contact with Patient 01/04/21 1021     (approximate)  I have reviewed the triage vital signs and the nursing notes.   HISTORY  Chief Complaint Abnormal Lab    HPI Diane Weiss is a 70 y.o. female with a history of peptic ulcer disease presents to the ER for evaluation of low hemoglobin.  States she been having generalized malaise weakness and fatigue for past several days went to urgent care that her hemoglobin was low and was told to follow-up with her PCP she followed up with her PCP today and her hemoglobin continues to drop and as her hemoglobin is 6.5 was directed to the ER for further evaluation.  She is denying any melena but states that over a week ago she did have multiple episodes of black tarry stool.  Took a MiraLAX.  Did not seek medical care at that time.  She did take an aspirin today because she was having some chest discomfort but denies any chest pain right now.  She has not been taking any NSAIDs.  Not on any blood thinners.    Past Medical History:  Diagnosis Date  . History of stomach ulcers   . Hyperlipidemia   . Thyroid disease   . Vertigo    no episodes for several years  . Wears dentures    partial lower   Family History  Problem Relation Age of Onset  . Hypertension Mother   . Diabetes Mother   . Hypertension Father   . Cancer Cousin   . Breast cancer Sister 36  . Breast cancer Maternal Aunt 75  . Breast cancer Paternal Aunt 85   Past Surgical History:  Procedure Laterality Date  . CARPAL TUNNEL RELEASE Right 07/07/2016   Procedure: OPEN CARPAL TUNNEL RELEASE;  Surgeon: Leanor Kail, MD;  Location: [facility];  Service: Orthopedics;  Laterality: Right;  . KNEE SURGERY  1990  . STOMACH SURGERY  1983   bleeding ulcers   Patient Active Problem List   Diagnosis Date Noted  . 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 Head mass 01/22/2014  . Dermoid cyst of scalp 01/22/2014      Prior to Admission medications   Medication Sig Start Date End Date Taking? Authorizing Provider  baclofen (LIORESAL) 10 MG tablet Take 10 mg by mouth daily.    [provider]  calcium carbonate (OS-CAL) 600 MG TABS tablet Take 600 mg by mouth 2 (two) times daily with a meal.    [provider]  cholecalciferol (VITAMIN D) 1000 UNITS tablet Take 1,000 Units by mouth 2 (two) times daily.    [provider]  levothyroxine (SYNTHROID, LEVOTHROID) 75 MCG tablet Take 75 mcg by mouth daily before breakfast.    [provider]  lovastatin (ALTOPREV) 40 MG 24 hr tablet Take 40 mg by mouth at bedtime.    [provider]  Lutein 20 MG TABS Take by mouth.    [provider]  meloxicam (MOBIC) 15 MG tablet Take 1 tablet (15 mg total) by mouth daily. 06/11/20   Edrick Kins, DPM  methylPREDNISolone (MEDROL DOSEPAK) 4 MG TBPK tablet 6 day dose pack - take as directed 06/11/20   Edrick Kins, DPM  traMADol Veatrice Bourbon) 50 MG tablet  11/16/18   [provider]    Allergies Patient has no known allergies.  Social History Social History   Tobacco Use  . Smoking status: Former Smoker    Quit date: 09/11/1981    Years since quitting: 39.3  . Smokeless tobacco: Never Used  Substance Use Topics  . Alcohol use: No  . Drug use: No    Review of Systems Patient denies headaches, rhinorrhea, blurry vision, numbness, shortness of breath, chest pain, edema, cough, abdominal pain, nausea, vomiting, diarrhea, dysuria, fevers, rashes or hallucinations unless otherwise stated above in HPI. ____________________________________________   PHYSICAL EXAM:  VITAL SIGNS: Vitals:   01/04/21 1002  BP: (!) 151/71  Resp: 20  Temp: 99.1 F (37.3 C)  SpO2: 97%    Constitutional: Alert and oriented.  Eyes: Conjunctivae are normal.  Head: Atraumatic. Nose: No  congestion/rhinnorhea. Mouth/Throat: Mucous membranes are moist.   Neck: No stridor. Painless ROM.  Cardiovascular: Normal rate, regular rhythm. Grossly normal heart sounds.  Good peripheral circulation. Respiratory: Normal respiratory effort.  No retractions. Lungs CTAB. Gastrointestinal: Soft and nontender. No distention. No abdominal bruits. No CVA tenderness. Genitourinary:  Musculoskeletal: No lower extremity tenderness nor edema.  No joint effusions. Neurologic:  Normal speech and language. No gross focal neurologic deficits are appreciated. No facial droop Skin:  Skin is warm, dry and intact. No rash noted. Psychiatric: Mood and affect are normal. Speech and behavior are normal.  ____________________________________________   LABS (all labs ordered are listed, but only abnormal results are displayed)  Results for orders placed or performed during the hospital encounter of 01/04/21 (from the past 24 hour(s))  Comprehensive metabolic panel     Status: Abnormal   Collection Time: 01/04/21  9:55 AM  Result Value Ref Range   Sodium 137 135 - 145 mmol/L   Potassium 3.6 3.5 - 5.1 mmol/L   Chloride 106 98 - 111 mmol/L   CO2 23 22 - 32 mmol/L   Glucose, Bld 114 (H) 70 - 99 mg/dL   BUN 12 8 - 23 mg/dL   Creatinine, Ser 0.69 0.44 - 1.00 mg/dL   Calcium 8.4 (L) 8.9 - 10.3 mg/dL   Total Protein 6.6 6.5 - 8.1 g/dL   Albumin 3.9 3.5 - 5.0 g/dL   AST 18 15 - 41 U/L   ALT 12 0 - 44 U/L   Alkaline Phosphatase 36 (L) 38 - 126 U/L   Total Bilirubin 0.6 0.3 - 1.2 mg/dL   GFR, Estimated >60 >60 mL/min   Anion gap 8 5 - 15  CBC     Status: Abnormal   Collection Time: 01/04/21  9:55 AM  Result Value Ref Range   WBC 9.5 4.0 - 10.5 K/uL   RBC 2.50 (L) 3.87 - 5.11 MIL/uL   Hemoglobin 6.8 (L) 12.0 - 15.0 g/dL   HCT 22.6 (L) 36.0 - 46.0 %   MCV 90.4 80.0 - 100.0 fL   MCH 27.2 26.0 - 34.0 pg   MCHC 30.1 30.0 - 36.0 g/dL   RDW 14.8 11.5 - 15.5 %   Platelets 397 150 - 400 K/uL   nRBC 0.3 (H)  0.0 - 0.2 %  ABO/Rh     Status: None (Preliminary result)   Collection Time: 01/04/21  9:55 AM  Result Value Ref Range   ABO/RH(D) PENDING   Prepare RBC (crossmatch)     Status: None (Preliminary result)   Collection Time: 01/04/21 10:29 AM  Result Value Ref Range   Order Confirmation PENDING    ____________________________________________  EKG My review and personal interpretation at Time: 10:59  Indication: weakness  Rate: 70  Rhythm: sinus Axis: normal Other: normal intervals, no stemi ____________________________________________  RADIOLOGY  I personally reviewed all radiographic images ordered to evaluate for the above acute complaints and reviewed radiology reports and findings.  These findings were personally discussed with the patient.  Please see medical record for radiology report.  ____________________________________________   PROCEDURES  Procedure(s) performed:  .Critical Care Performed by: Merlyn Lot, MD Authorized by: Merlyn Lot, MD   Critical care provider statement:    Critical care time (minutes):  32   Critical care time was exclusive of:  Separately billable procedures and treating other patients   Critical care was necessary to treat or prevent imminent or life-threatening deterioration of the following conditions:  Circulatory failure   Critical care was time spent personally by me on the following activities:  Development of treatment plan with patient or surrogate, discussions with consultants, evaluation of patient's response to treatment, examination of patient, obtaining history from patient or surrogate, ordering and performing treatments and interventions, ordering and review of laboratory studies, ordering and review of radiographic studies, pulse oximetry, re-evaluation of patient's condition and review of old charts      Critical Care performed: no ____________________________________________   INITIAL IMPRESSION / [place] / ED COURSE  Pertinent labs & imaging results that were available during my care of the patient were reviewed by me and considered in my medical decision making (see chart for details).   DDX: anemia, chf, gastritis, pud, abla, ida  Diane Weiss is a 70 y.o. who presents to the ED with presentation as described above.  Patient with evidence of acute anemia patient is symptomatic from the anemia.  Denies any melena right now but last week had melena and sounds like she is having symptoms of GI bleed at times given her history of ulcers.  Given her significant drop in hemoglobin down to 6.5 do feel she will require blood transfusion.  Will give Protonix IV.  Given her precipitous drop will discussed with hospitalist for additional transfusion and hemodynamic monitoring.     The patient was evaluated in Emergency Department today for the symptoms described in the history of present illness. He/she was evaluated in the context of the global COVID-19 pandemic, which necessitated consideration that the patient might be at risk for infection with the SARS-CoV-2 virus that causes COVID-19. Institutional protocols and algorithms that pertain to the evaluation of patients at risk for COVID-19 are in a state of rapid change based on information released by regulatory bodies including the CDC and federal and state organizations. These policies and algorithms were followed during the patient's care in the ED.  As part of my medical decision making, I reviewed the following data within the [facility] notes reviewed and incorporated, Labs reviewed, notes from prior ED visits and [employer] Controlled Substance Database   ____________________________________________   FINAL CLINICAL IMPRESSION(S) / ED DIAGNOSES  Final diagnoses:  Symptomatic anemia      NEW MEDICATIONS STARTED DURING THIS VISIT:  New Prescriptions   No medications on file     Note:  This document was  prepared using Dragon voice recognition software and may include unintentional dictation errors.    Merlyn Lot, MD 01/04/21 1106

## 2021-01-04 NOTE — Consult Note (Signed)
Arlyss Repress, MD [address]  [address]  [place], [place] [address]  Main: [phone]  Fax: [phone] Pager: [phone]   Consultation  Referring Provider:     No ref. provider found Primary Care Physician:  Jerl Mina, MD Primary Gastroenterologist: Gentry Fitz         Reason for Consultation:     Melena, acute symptomatic anemia  Date of Admission:  01/04/2021 Date of Consultation:  01/04/2021         HPI:   Diane Weiss is a 70 y.o. female with known history of peptic ulcer disease s/p gastric surgery in her 30s is admitted with melena and severe symptomatic anemia.  Patient originally went to see her PCP, Dr. Burnett Sheng at [facility] today.  Her hemoglobin was 6.5 and therefore she was recommended to go to ER.  Her hemoglobin was 12.8 with normal MCV it was a month ago.  TSH normal, normal LFTs.  She reported generalized malaise, weakness, fatigue for last several days.  She originally went to urgent care and she was told that her hemoglobin was low.  She went to see her PCP today, her hemoglobin continued to remain low.  She denies black stools today however, was experiencing black tarry stools about a week ago. Repeat hemoglobin in the ER was 6.8, receiving blood transfusions, kept n.p.o., on pantoprazole drip.  Patient is hemodynamically stable.  GI is consulted for further evaluation  She denies smoking or etoh use  NSAIDs: none  Antiplts/Anticoagulants/Anti thrombotics: none  GI Procedures: Colonoscopy 3 years ago  Past Medical History:  Diagnosis Date  . History of stomach ulcers   . Hyperlipidemia   . Thyroid disease   . Vertigo    no episodes for several years  . Wears dentures    partial lower    Past Surgical History:  Procedure Laterality Date  . CARPAL TUNNEL RELEASE Right 07/07/2016   Procedure: OPEN CARPAL TUNNEL RELEASE;  Surgeon: Erin Sons, MD;  Location: [facility];  Service: Orthopedics;  Laterality: Right;   . KNEE SURGERY  1990  . STOMACH SURGERY  1983   bleeding ulcers    Prior to Admission medications   Medication Sig Start Date End Date Taking? Authorizing Provider  baclofen (LIORESAL) 10 MG tablet Take 10 mg by mouth daily.   Yes [provider]  Cholecalciferol (D3) 50 MCG (2000 UT) TABS Take 2,000 tablets by mouth at bedtime.   Yes [provider]  levothyroxine (SYNTHROID, LEVOTHROID) 75 MCG tablet Take 75 mcg by mouth daily before breakfast.   Yes [provider]  lovastatin (ALTOPREV) 40 MG 24 hr tablet Take 40 mg by mouth at bedtime.   Yes [provider]  Lutein 20 MG TABS Take by mouth.   Yes [provider]  traMADol (ULTRAM) 50 MG tablet Take 50 mg by mouth 2 (two) times daily. 11/16/18  Yes [provider]  calcium carbonate (OS-CAL) 600 MG TABS tablet Take 600 mg by mouth 2 (two) times daily with a meal. Patient not taking: Reported on 01/04/2021    [provider]  meloxicam (MOBIC) 15 MG tablet Take 1 tablet (15 mg total) by mouth daily. Patient not taking: Reported on 01/04/2021 06/11/20   Felecia Shelling, DPM  methylPREDNISolone (MEDROL DOSEPAK) 4 MG TBPK tablet 6 day dose pack - take as directed Patient not taking: Reported on 01/04/2021 06/11/20   Felecia Shelling, DPM   Current Facility-Administered Medications:  .  0.9 %  sodium chloride infusion, , Intravenous, Continuous, Lorretta Harp, MD, Last Rate: 100 mL/hr at 01/04/21 2144, New Bag at 01/04/21 2144 .  0.9 %  sodium chloride infusion, , Intravenous, Continuous, Tazia Illescas, Loel Dubonnet, MD, Last Rate: 20 mL/hr at 01/04/21 1505, New Bag at 01/04/21 1505 .  acetaminophen (TYLENOL) tablet 650 mg, 650 mg, Oral, Q6H PRN, Lorretta Harp, MD .  baclofen (LIORESAL) tablet 10 mg, 10 mg, Oral, Daily, Lorretta Harp, MD, 10 mg at 01/04/21 1922 .  cholecalciferol (VITAMIN D3) tablet 2,000 Units, 2,000 Units, Oral, QHS, Lorretta Harp, MD, 2,000 Units at 01/04/21 2129 .  hydrALAZINE  (APRESOLINE) injection 5 mg, 5 mg, Intravenous, Q2H PRN, Lorretta Harp, MD .  Melene Muller ON 01/05/2021] iron sucrose (VENOFER) 300 mg in sodium chloride 0.9 % 250 mL IVPB, 300 mg, Intravenous, Once, Lorretta Harp, MD .  Melene Muller ON 01/05/2021] levothyroxine (SYNTHROID) tablet 75 mcg, 75 mcg, Oral, QAC breakfast, Lorretta Harp, MD .  morphine 2 MG/ML injection 1 mg, 1 mg, Intravenous, Q4H PRN, Lorretta Harp, MD .  ondansetron (ZOFRAN) injection 4 mg, 4 mg, Intravenous, Q8H PRN, Lorretta Harp, MD .  pantoprazole (PROTONIX) 80 mg in sodium chloride 0.9 % 100 mL (0.8 mg/mL) infusion, 8 mg/hr, Intravenous, Continuous, Lorretta Harp, MD, Last Rate: 10 mL/hr at 01/04/21 2207, 8 mg/hr at 01/04/21 2207 .  pantoprazole (PROTONIX) 80 mg in sodium chloride 0.9 % 100 mL IVPB, 80 mg, Intravenous, Once, Lorretta Harp, MD .  Melene Muller ON 01/07/2021] pantoprazole (PROTONIX) injection 40 mg, 40 mg, Intravenous, Q12H, Lorretta Harp, MD .  pravastatin (PRAVACHOL) tablet 40 mg, 40 mg, Oral, QHS, Lorretta Harp, MD, 40 mg at 01/04/21 2129 .  traMADol (ULTRAM) tablet 50 mg, 50 mg, Oral, BID, Lorretta Harp, MD, 50 mg at 01/04/21 2129   Family History  Problem Relation Age of Onset  . Hypertension Mother   . Diabetes Mother   . Hypertension Father   . Cancer Cousin   . Breast cancer Sister 72  . Breast cancer Maternal Aunt 75  . Breast cancer Paternal Aunt 53     Social History   Tobacco Use  . Smoking status: Former Smoker    Quit date: 09/11/1981    Years since quitting: 39.3  . Smokeless tobacco: Never Used  Substance Use Topics  . Alcohol use: No  . Drug use: No    Allergies as of 01/04/2021  . (No Known Allergies)    Review of Systems:    All systems reviewed and negative except where noted in HPI.   Physical Exam:  Vital signs in last 24 hours: Temp:  [98 F (36.7 C)-99.1 F (37.3 C)] 98.9 F (37.2 C) (04/26 2141) Pulse Rate:  [64-78] 66 (04/26 2141) Resp:  [11-22] 18 (04/26 2141) BP: (108-153)/(50-87) 126/77 (04/26 2141) SpO2:   [96 %-100 %] 100 % (04/26 2141) Weight:  [70.3 kg-74.8 kg] 74.8 kg (04/26 1002)   General:   Pleasant, cooperative in NAD Head:  Normocephalic and atraumatic. Eyes:   No icterus.   Conjunctiva pink. PERRLA. Ears:  Normal auditory acuity. Neck:  Supple; no masses or thyroidomegaly Lungs: Respirations even and unlabored. Lungs clear to auscultation bilaterally.   No wheezes, crackles, or rhonchi.  Heart:  Regular rate and rhythm;  Without murmur, clicks, rubs or gallops Abdomen:  Soft, nondistended, nontender, midline vertical old scar. Normal bowel sounds. No appreciable masses or hepatomegaly.  No rebound or guarding.  Rectal:  Not performed. Msk:  Symmetrical without gross deformities.  Strength generalized weakness  Extremities:  Without edema, cyanosis or clubbing. Neurologic:  Alert and oriented x3;  grossly normal neurologically. Skin:  Intact without significant lesions or rashes. Psych:  Alert and cooperative. Normal affect.  LAB RESULTS: CBC Latest Ref Rng & Units 01/04/2021 01/04/2021 01/04/2021  WBC 4.0 - 10.5 K/uL 6.0 6.9 9.5  Hemoglobin 12.0 - 15.0 g/dL 8.1(L) 6.4(L) 6.8(L)  Hematocrit 36.0 - 46.0 % 26.1(L) 21.0(L) 22.6(L)  Platelets 150 - 400 K/uL 324 359 397    BMET BMP Latest Ref Rng & Units 01/04/2021  Glucose 70 - 99 mg/dL 114(H)  BUN 8 - 23 mg/dL 12  Creatinine 0.44 - 1.00 mg/dL 0.69  Sodium 135 - 145 mmol/L 137  Potassium 3.5 - 5.1 mmol/L 3.6  Chloride 98 - 111 mmol/L 106  CO2 22 - 32 mmol/L 23  Calcium 8.9 - 10.3 mg/dL 8.4(L)    LFT Hepatic Function Latest Ref Rng & Units 01/04/2021  Total Protein 6.5 - 8.1 g/dL 6.6  Albumin 3.5 - 5.0 g/dL 3.9  AST 15 - 41 U/L 18  ALT 0 - 44 U/L 12  Alk Phosphatase 38 - 126 U/L 36(L)  Total Bilirubin 0.3 - 1.2 mg/dL 0.6     STUDIES: DG Chest Portable 1 View  Result Date: 01/04/2021 CLINICAL DATA:  Chest pain EXAM: PORTABLE CHEST 1 VIEW COMPARISON:  None. FINDINGS: A safety pin presumably overlies the midline mid  chest region. There is slight scarring in the right upper lobe and right base regions. No edema or airspace opacity. Heart size and pulmonary vascularity are normal. No adenopathy. No pneumothorax. No bone lesions. IMPRESSION: Safety pin presumably overlies the midline chest region. No edema or airspace opacity. Areas of mild scarring noted on the right. Heart size within normal limits. Electronically Signed   By: Lowella Grip III M.D.   On: 01/04/2021 11:11      Impression / Plan:   Diane Weiss is a 70 y.o. female with h/o gastric surgery presented with melena about a week ago and severe symptomatic anemia.  Normal BUN/creatinine which represents old bleed.  Labs revealed severe iron deficiency.  No evidence of chronic liver disease/cirrhosis.  S/p blood transfusion due to symptomatic anemia Continue pantoprazole drip Recommend IV iron Monitor CBC closely to maintain hemoglobin above 7 Recommend upper endoscopy tomorrow, if EGD is unremarkable recommend colonoscopy +/- VCE N.p.o. effective midnight Okay with clear liquids today  I have discussed alternative options, risks & benefits,  which include, but are not limited to, bleeding, infection, perforation,respiratory complication & drug reaction.  The patient agrees with this plan & written consent will be obtained.     Thank you for involving me in the care of this patient.      LOS: 0 days   Sherri Sear, MD  01/04/2021, 10:22 PM   Note: This dictation was prepared with Dragon dictation along with smaller phrase technology. Any transcriptional errors that result from this process are unintentional.

## 2021-01-04 NOTE — H&P (Signed)
History and Physical    Diane Weiss DOB: November 02, 1950 DOA: 01/04/2021  Referring MD/NP/PA:   PCP: Maryland Pink, MD   Patient coming from:  The patient is coming from home.  At baseline, pt is independent for most of ADL.        Chief Complaint: dark stool and low Hgb  HPI: Diane Weiss is a 70 y.o. female with medical history significant of stomach ulcer bleeding, HLD, hypothyroidism, vetigo, who presents with dark stool and low Hgb.  Patient states that she has had several episode of black stool bowel movement in the past several days, but did not have dark stool bowel movement today.  Developed generalized weakness and fatigue.  No unilateral numbness or tingling in extremities.  No facial droop or slurred speech.  Patient has nausea, but no vomiting or abdominal pain.  No fever or chills.  Patient states that she had chest discomfort earlier, which has resolved.  She states that she took aspirin because of chest discomfort earlier.  Currently patient does not have chest pain, cough, shortness of breath.  No symptoms of UTI.  She states that she had a history of stomach ulcer with bleeding and had partial stomach removed in 1983. Pt was seen in UC and found to have low Hgb, therefore sent to ED for further evaluation and treatment   ED Course: pt was found to have hemoglobin 6.8 (12.8 on 11/24/2020), troponin level 7, pending COVID PCR, electrolytes renal function okay, temperature 99.1, blood pressure 151/71, RR 20, oxygen saturation 97% on room air.  CXR showed: Safety pin presumably overlies the midline chest region. No edema or airspace opacity. Areas of mild scarring noted on the right. Heart size within normal limits.  Review of Systems:   General: no fevers, chills, no body weight gain, has fatigue HEENT: no blurry vision, hearing changes or sore throat Respiratory: no dyspnea, coughing, wheezing CV: had chest discomfort, no palpitations GI: Has nausea, no  vomiting, abdominal pain, diarrhea, constipation. Has dark stool GU: no dysuria, burning on urination, increased urinary frequency, hematuria.  Ext: no leg edema Neuro: no unilateral weakness, numbness, or tingling, no vision change or hearing loss Skin: no rash, no skin tear. MSK: No muscle spasm, no deformity, no limitation of range of movement in spin Heme: No easy bruising.  Travel history: No recent long distant travel.  Allergy: No Known Allergies  Past Medical History:  Diagnosis Date  . History of stomach ulcers   . Hyperlipidemia   . Thyroid disease   . Vertigo    no episodes for several years  . Wears dentures    partial lower    Past Surgical History:  Procedure Laterality Date  . CARPAL TUNNEL RELEASE Right 07/07/2016   Procedure: OPEN CARPAL TUNNEL RELEASE;  Surgeon: Leanor Kail, MD;  Location: [place];  Service: Orthopedics;  Laterality: Right;  . KNEE SURGERY  1990  . STOMACH SURGERY  1983   bleeding ulcers    Social History:  reports that she quit smoking about 39 years ago. She has never used smokeless tobacco. She reports that she does not drink alcohol and does not use drugs.  Family History:  Family History  Problem Relation Age of Onset  . Hypertension Mother   . Diabetes Mother   . Hypertension Father   . Cancer Cousin   . Breast cancer Sister 79  . Breast cancer Maternal Aunt 75  . Breast cancer Paternal Aunt 23  Prior to Admission medications   Medication Sig Start Date End Date Taking? Authorizing Provider  baclofen (LIORESAL) 10 MG tablet Take 10 mg by mouth daily.    [provider]  calcium carbonate (OS-CAL) 600 MG TABS tablet Take 600 mg by mouth 2 (two) times daily with a meal.    [provider]  cholecalciferol (VITAMIN D) 1000 UNITS tablet Take 1,000 Units by mouth 2 (two) times daily.    [provider]  levothyroxine (SYNTHROID, LEVOTHROID) 75 MCG tablet Take 75 mcg by mouth daily  before breakfast.    [provider]  lovastatin (ALTOPREV) 40 MG 24 hr tablet Take 40 mg by mouth at bedtime.    [provider]  Lutein 20 MG TABS Take by mouth.    [provider]  meloxicam (MOBIC) 15 MG tablet Take 1 tablet (15 mg total) by mouth daily. 06/11/20   Edrick Kins, DPM  methylPREDNISolone (MEDROL DOSEPAK) 4 MG TBPK tablet 6 day dose pack - take as directed 06/11/20   Edrick Kins, DPM  traMADol Veatrice Bourbon) 50 MG tablet  11/16/18   [provider]    Physical Exam: Vitals:   01/04/21 0952 01/04/21 1002  BP:  (!) 151/71  Resp:  20  Temp:  99.1 F (37.3 C)  TempSrc:  Oral  SpO2:  97%  Weight: 70.3 kg 74.8 kg  Height: 5' 5.5" (1.664 m) 5\' 5"  (1.651 m)   General: Not in acute distress. Pale looking. HEENT:       Eyes: PERRL, EOMI, no scleral icterus.       ENT: No discharge from the ears and nose, no pharynx injection, no tonsillar enlargement.        Neck: No JVD, no bruit, no mass felt. Heme: No neck lymph node enlargement. Cardiac: S1/S2, RRR, No murmurs, No gallops or rubs. Respiratory: No rales, wheezing, rhonchi or rubs. GI: Soft, nondistended, nontender, no rebound pain, no organomegaly, BS present. GU: No hematuria Ext: No pitting leg edema bilaterally. 1+DP/PT pulse bilaterally. Musculoskeletal: No joint deformities, No joint redness or warmth, no limitation of ROM in spin. Skin: No rashes.  Neuro: Alert, oriented X3, cranial nerves II-XII grossly intact, moves all extremities normally.  Psych: Patient is not psychotic, no suicidal or hemocidal ideation.  Labs on Admission: I have personally reviewed following labs and imaging studies  CBC: Recent Labs  Lab 01/04/21 0955  WBC 9.5  HGB 6.8*  HCT 22.6*  MCV 90.4  PLT 242   Basic Metabolic Panel: Recent Labs  Lab 01/04/21 0955  NA 137  K 3.6  CL 106  CO2 23  GLUCOSE 114*  BUN 12  CREATININE 0.69  CALCIUM 8.4*   GFR: Estimated Creatinine Clearance: 66.2  mL/min (by C-G formula based on SCr of 0.69 mg/dL). Liver Function Tests: Recent Labs  Lab 01/04/21 0955  AST 18  ALT 12  ALKPHOS 36*  BILITOT 0.6  PROT 6.6  ALBUMIN 3.9   No results for input(s): LIPASE, AMYLASE in the last 168 hours. No results for input(s): AMMONIA in the last 168 hours. Coagulation Profile: No results for input(s): INR, PROTIME in the last 168 hours. Cardiac Enzymes: No results for input(s): CKTOTAL, CKMB, CKMBINDEX, TROPONINI in the last 168 hours. BNP (last 3 results) No results for input(s): PROBNP in the last 8760 hours. HbA1C: No results for input(s): HGBA1C in the last 72 hours. CBG: No results for input(s): GLUCAP in the last 168 hours. Lipid Profile: No results  for input(s): CHOL, HDL, LDLCALC, TRIG, CHOLHDL, LDLDIRECT in the last 72 hours. Thyroid Function Tests: No results for input(s): TSH, T4TOTAL, FREET4, T3FREE, THYROIDAB in the last 72 hours. Anemia Panel: No results for input(s): VITAMINB12, FOLATE, FERRITIN, TIBC, IRON, RETICCTPCT in the last 72 hours. Urine analysis: No results found for: COLORURINE, APPEARANCEUR, LABSPEC, PHURINE, GLUCOSEU, HGBUR, BILIRUBINUR, KETONESUR, PROTEINUR, UROBILINOGEN, NITRITE, LEUKOCYTESUR Sepsis Labs: @LABRCNTIP (procalcitonin:4,lacticidven:4) )No results found for this or any previous visit (from the past 240 hour(s)).   Radiological Exams on Admission: DG Chest Portable 1 View  Result Date: 01/04/2021 CLINICAL DATA:  Chest pain EXAM: PORTABLE CHEST 1 VIEW COMPARISON:  None. FINDINGS: A safety pin presumably overlies the midline mid chest region. There is slight scarring in the right upper lobe and right base regions. No edema or airspace opacity. Heart size and pulmonary vascularity are normal. No adenopathy. No pneumothorax. No bone lesions. IMPRESSION: Safety pin presumably overlies the midline chest region. No edema or airspace opacity. Areas of mild scarring noted on the right. Heart size within normal  limits. Electronically Signed   By: Lowella Grip III M.D.   On: 01/04/2021 11:11     EKG: I have personally reviewed.  Sinus rhythm, QTC 471, low voltage, nonspecific T wave change  Assessment/Plan Principal Problem:   GI bleeding Active Problems:   Hyperlipidemia   Acute blood loss anemia   Symptomatic anemia   Hypothyroidism   GI bleeding, acute blood loss anemia and symptomatic anemia: hgb 12.8 -->6.8.  Likely due to upper GI bleeding given history of stomach ulcer.  Mobic is on medication list, sounds like patient is not taking this medication. Dr. Marius Ditch of GI is consulted --> planing EGD in AM.  - will place in med-surg bed obs - transfuse 2 units of blood now - NPO after MN for possible EGD - IVF: NS 100 mL/hr - Start IV pantoprazole gtt - Zofran IV for nausea - Avoid NSAIDs and SQ heparin - Maintain IV access (2 large bore IVs if possible). - Monitor closely and follow q6h cbc, transfuse as necessary, if Hgb<7.0 - LaB: INR, PTT and type screen  Hyperlipidemia -Lovastatin  Hypothyroidism -Synthroid    DVT ppx: SCD Code Status: Full code Family Communication: not done, no family member is at bed side.    I left message to her son Disposition Plan:  Anticipate discharge back to previous environment Consults called:  Dr. Marius Ditch of GI Admission status and Level of care: Med-Surg:  for obs   Status is: Observation  The patient remains OBS appropriate and will d/c before 2 midnights.  Dispo: The patient is from: Home              Anticipated d/c is to: Home              Patient currently is not medically stable to d/c.   Difficult to place patient No            Date of Service 01/04/2021    [facility]   If 7PM-7AM, please contact night-coverage [email or web address] 01/04/2021, 11:30 AM

## 2021-01-04 NOTE — ED Triage Notes (Signed)
Low H/H today.  6.5/ 22.4  AAOx3.  Skin warm and dry. NAD

## 2021-01-04 NOTE — Plan of Care (Signed)

## 2021-01-04 NOTE — ED Notes (Signed)
Blood consent obtained

## 2021-01-05 ENCOUNTER — Encounter
Admission: EM | Disposition: A | Discharge status: Home / Self Care | Payer: Self-pay | Source: Home / Self Care | Attending: Student in an Organized Health Care Education/Training Program

## 2021-01-05 ENCOUNTER — Encounter: Payer: Self-pay | Admitting: Internal Medicine

## 2021-01-05 ENCOUNTER — Observation Stay: Payer: PPO | Admitting: Anesthesiology

## 2021-01-05 DIAGNOSIS — K259 Gastric ulcer, unspecified as acute or chronic, without hemorrhage or perforation: Secondary | ICD-10-CM | POA: Diagnosis not present

## 2021-01-05 DIAGNOSIS — K2970 Gastritis, unspecified, without bleeding: Secondary | ICD-10-CM | POA: Diagnosis not present

## 2021-01-05 DIAGNOSIS — D62 Acute posthemorrhagic anemia: Secondary | ICD-10-CM | POA: Diagnosis not present

## 2021-01-05 DIAGNOSIS — D649 Anemia, unspecified: Secondary | ICD-10-CM | POA: Diagnosis not present

## 2021-01-05 DIAGNOSIS — K921 Melena: Secondary | ICD-10-CM | POA: Diagnosis not present

## 2021-01-05 DIAGNOSIS — K254 Chronic or unspecified gastric ulcer with hemorrhage: Secondary | ICD-10-CM | POA: Diagnosis not present

## 2021-01-05 DIAGNOSIS — K5730 Diverticulosis of large intestine without perforation or abscess without bleeding: Secondary | ICD-10-CM | POA: Diagnosis not present

## 2021-01-05 DIAGNOSIS — K449 Diaphragmatic hernia without obstruction or gangrene: Secondary | ICD-10-CM | POA: Diagnosis not present

## 2021-01-05 HISTORY — PX: ESOPHAGOGASTRODUODENOSCOPY (EGD) WITH PROPOFOL: SHX5813

## 2021-01-05 LAB — CBC
HCT: 29.6 % — ABNORMAL LOW (ref 36.0–46.0)
Hemoglobin: 9.5 g/dL — ABNORMAL LOW (ref 12.0–15.0)
MCH: 27.4 pg (ref 26.0–34.0)
MCHC: 32.1 g/dL (ref 30.0–36.0)
MCV: 85.3 fL (ref 80.0–100.0)
Platelets: 300 10*3/uL (ref 150–400)
RBC: 3.47 MIL/uL — ABNORMAL LOW (ref 3.87–5.11)
RDW: 16.5 % — ABNORMAL HIGH (ref 11.5–15.5)
WBC: 5.7 10*3/uL (ref 4.0–10.5)
nRBC: 0.3 % — ABNORMAL HIGH (ref 0.0–0.2)

## 2021-01-05 LAB — TYPE AND SCREEN
ABO/RH(D): A POS
Antibody Screen: NEGATIVE
Unit division: 0
Unit division: 0

## 2021-01-05 LAB — BPAM RBC
Blood Product Expiration Date: 202205192359
Blood Product Expiration Date: 202205192359
ISSUE DATE / TIME: 202204261212
ISSUE DATE / TIME: 202204261755
Unit Type and Rh: 6200
Unit Type and Rh: 6200

## 2021-01-05 LAB — BASIC METABOLIC PANEL
Anion gap: 6 (ref 5–15)
BUN: 8 mg/dL (ref 8–23)
CO2: 24 mmol/L (ref 22–32)
Calcium: 8.4 mg/dL — ABNORMAL LOW (ref 8.9–10.3)
Chloride: 111 mmol/L (ref 98–111)
Creatinine, Ser: 0.59 mg/dL (ref 0.44–1.00)
GFR, Estimated: >60 mL/min (ref >60)
Glucose, Bld: 89 mg/dL (ref 70–99)
Potassium: 4.1 mmol/L (ref 3.5–5.1)
Sodium: 141 mmol/L (ref 135–145)

## 2021-01-05 LAB — GLUCOSE, CAPILLARY: Glucose-Capillary: 130 mg/dL — ABNORMAL HIGH (ref 70–99)

## 2021-01-05 SURGERY — ESOPHAGOGASTRODUODENOSCOPY (EGD) WITH PROPOFOL
Anesthesia: General

## 2021-01-05 MED ORDER — FERROUS SULFATE 325 (65 FE) MG PO TBEC: 325.0000 mg | DELAYED_RELEASE_TABLET | Freq: Every day | ORAL | 0 refills | Status: DC

## 2021-01-05 MED ORDER — PANTOPRAZOLE SODIUM 40 MG PO TBEC: 40.0000 mg | DELAYED_RELEASE_TABLET | Freq: Two times a day (BID) | ORAL | 0 refills | Status: DC

## 2021-01-05 MED ORDER — POLYETHYLENE GLYCOL 3350 17 G PO PACK: 17.0000 g | PACK | Freq: Every day | ORAL | 0 refills | Status: DC | PRN

## 2021-01-05 MED ORDER — LIDOCAINE HCL (CARDIAC) PF 100 MG/5ML IV SOSY
PREFILLED_SYRINGE | INTRAVENOUS | Status: DC | PRN
  Administered 2021-01-05: 100 mg via INTRAVENOUS

## 2021-01-05 MED ORDER — PROPOFOL 10 MG/ML IV BOLUS
INTRAVENOUS | Status: DC | PRN
  Administered 2021-01-05: 50 mg via INTRAVENOUS

## 2021-01-05 MED ORDER — PROPOFOL 500 MG/50ML IV EMUL
INTRAVENOUS | Status: DC | PRN
  Administered 2021-01-05: 150 ug/kg/min via INTRAVENOUS

## 2021-01-05 MED ORDER — PROPOFOL 500 MG/50ML IV EMUL
INTRAVENOUS | Status: AC
  Filled 2021-01-05: qty 100

## 2021-01-05 NOTE — Anesthesia Preprocedure Evaluation (Signed)
Anesthesia Evaluation  Patient identified by MRN, date of birth, ID band Patient awake    Reviewed: Allergy & Precautions, H&P , NPO status , Patient's Chart, lab work & pertinent test results  History of Anesthesia Complications Negative for: history of anesthetic complications  Airway Mallampati: III  TM Distance: <3 FB Neck ROM: full    Dental  (+) Chipped, Poor Dentition, Missing   Pulmonary neg pulmonary ROS, neg shortness of breath, former smoker,    Pulmonary exam normal        Cardiovascular Exercise Tolerance: Good (-) angina(-) Past MI and (-) DOE negative cardio ROS Normal cardiovascular exam     Neuro/Psych  Neuromuscular disease negative psych ROS   GI/Hepatic negative GI ROS, Neg liver ROS,   Endo/Other  Hypothyroidism   Renal/GU negative Renal ROS  negative genitourinary   Musculoskeletal   Abdominal   Peds  Hematology  (+) Blood dyscrasia, anemia ,   Anesthesia Other Findings Past Medical History: No date: History of stomach ulcers No date: Hyperlipidemia No date: Thyroid disease No date: Vertigo     Comment:  no episodes for several years No date: Wears dentures     Comment:  partial lower  Past Surgical History: 07/07/2016: CARPAL TUNNEL RELEASE; Right     Comment:  Procedure: OPEN CARPAL TUNNEL RELEASE;  Surgeon: Leanor Kail, MD;  Location: [place];  Service:               Orthopedics;  Laterality: Right; 1990: KNEE SURGERY 1983: STOMACH SURGERY     Comment:  bleeding ulcers  BMI    Body Mass Index: 27.44 kg/m      Reproductive/Obstetrics negative OB ROS                             Anesthesia Physical Anesthesia Plan  ASA: III  Anesthesia Plan: General   Post-op Pain Management:    Induction: Intravenous  PONV Risk Score and Plan: Propofol infusion and TIVA  Airway Management Planned: Natural Airway and Nasal  Cannula  Additional Equipment:   Intra-op Plan:   Post-operative Plan:   Informed Consent: I have reviewed the patients History and Physical, chart, labs and discussed the procedure including the risks, benefits and alternatives for the proposed anesthesia with the patient or authorized representative who has indicated his/her understanding and acceptance.     Dental Advisory Given  Plan Discussed with: Anesthesiologist, CRNA and Surgeon  Anesthesia Plan Comments: (Patient consented for risks of anesthesia including but not limited to:  - adverse reactions to medications - risk of airway placement if required - damage to eyes, teeth, lips or other oral mucosa - nerve damage due to positioning  - sore throat or hoarseness - Damage to heart, brain, nerves, lungs, other parts of body or loss of life  Patient voiced understanding.)        Anesthesia Quick Evaluation

## 2021-01-05 NOTE — Progress Notes (Signed)
AVS reviewed with pt and pt verbalized understanding of all instructions. NAD noted prior to discharge and pt denies any concerns.

## 2021-01-05 NOTE — Plan of Care (Signed)

## 2021-01-05 NOTE — Discharge Summary (Addendum)
Discharge Summary  Diane Weiss YCX:[id number] DOB: 1950-09-25  PCP: [place] Pink, MD  Admit date: 01/04/2021 Discharge date: 01/05/2021  Time spent:  86mins  Recommendations for Outpatient Follow-up:  1. F/u with PCP within a week  for hospital discharge follow up, repeat cbc/bmp at follow up 2. F/u with GI Dr Marius Ditch  in 1-2 months  Discharge Diagnoses:  Active Hospital Problems   Diagnosis Date Noted  . GI bleeding 01/04/2021  . Hyperlipidemia   . Acute blood loss anemia   . Symptomatic anemia   . Hypothyroidism     Resolved Hospital Problems  No resolved problems to display.    Discharge Condition: stable  Diet recommendation: heart healthy  Filed Weights   01/04/21 0952 01/04/21 1002 01/05/21 0713  Weight: 70.3 kg 74.8 kg 74.8 kg    History of present illness: ( per admitting MD Dr Ivor Costa ) Chief Complaint: dark stool and low Hgb  HPI: Diane Weiss is a 70 y.o. female with medical history significant of stomach ulcer bleeding, HLD, hypothyroidism, vetigo, who presents with dark stool and low Hgb.  Patient states that she has had several episode of black stool bowel movement in the past several days, but did not have dark stool bowel movement today.  Developed generalized weakness and fatigue.  No unilateral numbness or tingling in extremities.  No facial droop or slurred speech.  Patient has nausea, but no vomiting or abdominal pain.  No fever or chills.  Patient states that she had chest discomfort earlier, which has resolved.  She states that she took aspirin because of chest discomfort earlier.  Currently patient does not have chest pain, cough, shortness of breath.  No symptoms of UTI.  She states that she had a history of stomach ulcer with bleeding and had partial stomach removed in 1983. Pt was seen in UC and found to have low Hgb, therefore sent to ED for further evaluation and treatment   ED Course: pt was found to have hemoglobin 6.8 (12.8 on  11/24/2020), troponin level 7, pending COVID PCR, electrolytes renal function okay, temperature 99.1, blood pressure 151/71, RR 20, oxygen saturation 97% on room air.  CXR showed: Safety pin presumably overlies the midline chest region. No edema or airspace opacity. Areas of mild scarring noted on the right. Heart size within normal limits.  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 Marius Ditch  Consultations:  GI Dr Marius Ditch  Discharge Exam: BP (!) 150/62 (BP Location: Right Arm)   Pulse 63   Temp 98.2 F (36.8 C)   Resp 18   Ht 5\' 5"  (1.651 m)   Wt 74.8 kg  SpO2 99%   BMI 27.44 kg/m   General: NAD Cardiovascular: RRR Respiratory: Normal respiratory effort  Discharge Instructions You were cared for by a hospitalist during your hospital stay. If you have any questions about your discharge medications or the care you received while you were in the hospital after you are discharged, you can call the unit and asked to speak with the hospitalist on call if the hospitalist that took care of you is not available. Once you are discharged, your primary care  physician will handle any further medical issues. Please note that NO REFILLS for any discharge medications will be authorized once you are discharged, as it is imperative that you return to your primary care physician (or establish a relationship with a primary care physician if you do not have one) for your aftercare needs so that they can reassess your need for medications and monitor your lab values.  Discharge Instructions    Diet - low sodium heart healthy   Complete by: As directed    Increase activity slowly   Complete by: As directed      Allergies as of 01/05/2021   No Known Allergies     Medication List    TAKE these medications   baclofen 10 MG tablet Commonly known as: LIORESAL Take 10 mg by mouth daily.   calcium carbonate 600 MG Tabs tablet Commonly known as: OS-CAL Take 600 mg by mouth 2 (two) times daily with a meal.   D3 50 MCG (2000 UT) Tabs Generic drug: Cholecalciferol Take 2,000 Units by mouth at bedtime.   ferrous sulfate 325 (65 FE) MG EC tablet Take 1 tablet (325 mg total) by mouth daily with breakfast.   levothyroxine 75 MCG tablet Commonly known as: SYNTHROID Take 75 mcg by mouth daily before breakfast.   lovastatin 40 MG 24 hr tablet Commonly known as: ALTOPREV Take 40 mg by mouth at bedtime.   Lutein 20 MG Tabs Take by mouth.   pantoprazole 40 MG tablet Commonly known as: Protonix Take 1 tablet (40 mg total) by mouth 2 (two) times daily.   polyethylene glycol 17 g packet Commonly known as: MiraLax Take 17 g by mouth daily as needed for moderate constipation.   traMADol 50 MG tablet Commonly known as: ULTRAM Take 50 mg by mouth 2 (two) times daily.      No Known Allergies  Follow-up Information    [place] Pink, MD Follow up.   Specialty: Family Medicine Why: hospital discharge follow up, pcp to repeat cbc to monitor anemia Contact information: [address] [facility] [place] [address] [phone]         Lin Landsman, MD Follow up in 1 month(s).   Specialty: Gastroenterology Why: gastric ulcer  Contact information: [place] [address] [phone]                The results of significant diagnostics from this hospitalization (including imaging, microbiology, ancillary and laboratory) are listed below for reference.    Significant Diagnostic Studies: DG Chest Portable 1 View  Result Date: 01/04/2021 CLINICAL DATA:  Chest pain EXAM: PORTABLE CHEST 1 VIEW COMPARISON:  None. FINDINGS: A safety pin presumably overlies the midline mid chest region. There is slight scarring in the right upper lobe and right base regions. No edema or airspace opacity. Heart size and pulmonary vascularity are normal. No adenopathy. No pneumothorax. No bone lesions. IMPRESSION: Safety pin presumably overlies the midline chest region. No edema  or airspace opacity. Areas of mild scarring noted on the right. Heart size within normal limits. Electronically Signed   By: Lowella Grip III M.D.   On: 01/04/2021 11:11    Microbiology: Recent Results (from the past 240 hour(s))  SARS CORONAVIRUS 2 (TAT 6-24 HRS) Nasopharyngeal Nasopharyngeal Swab     Status: None   Collection Time: 01/04/21  9:55 AM   Specimen: Nasopharyngeal Swab  Result Value Ref Range Status   SARS Coronavirus 2 NEGATIVE NEGATIVE Final    Comment: (NOTE) SARS-CoV-2 target nucleic acids are NOT DETECTED.  The SARS-CoV-2 RNA is generally detectable in upper and lower respiratory specimens during the acute phase of infection. Negative results do not preclude SARS-CoV-2 infection, do not rule out co-infections with other pathogens, and should not be used as the sole basis for treatment or other patient management decisions. Negative results must be combined with clinical observations, patient history, and epidemiological information. The expected result is Negative.  Fact Sheet for  Patients: SugarRoll.be  Fact Sheet for Healthcare Providers: https://www.woods-mathews.com/  This test is not yet approved or cleared by the [place] FDA and  has been authorized for detection and/or diagnosis of SARS-CoV-2 by FDA under an Emergency Use Authorization (EUA). This EUA will remain  in effect (meaning this test can be used) for the duration of the COVID-19 declaration under Se ction 564(b)(1) of the Act, 21 U.S.C. section 360bbb-3(b)(1), unless the authorization is terminated or revoked sooner.  Performed at [facility], [place] [address], [place], [place] [address]      Labs: Basic Metabolic Panel: Recent Labs  Lab 01/04/21 0955 01/05/21 0446  NA 137 141  K 3.6 4.1  CL 106 111  CO2 23 24  GLUCOSE 114* 89  BUN 12 8  CREATININE 0.69 0.59  CALCIUM 8.4* 8.4*   Liver Function Tests: Recent Labs  Lab 01/04/21 0955  AST 18  ALT 12  ALKPHOS 36*  BILITOT 0.6  PROT 6.6  ALBUMIN 3.9   No results for input(s): LIPASE, AMYLASE in the last 168 hours. No results for input(s): AMMONIA in the last 168 hours. CBC: Recent Labs  Lab 01/04/21 0955 01/04/21 1028 01/04/21 1539 01/04/21 2254 01/05/21 0446  WBC 9.5 6.9 6.0 6.3 5.7  HGB 6.8* 6.4* 8.1* 8.8* 9.5*  HCT 22.6* 21.0* 26.1* 27.2* 29.6*  MCV 90.4 89.7 88.2 84.5 85.3  PLT 397 359 324 290 300   Cardiac Enzymes: No results for input(s): CKTOTAL, CKMB, CKMBINDEX, TROPONINI in the last 168 hours. BNP: BNP (last 3 results) No results for input(s): BNP in the last 8760 hours.  ProBNP (last 3 results) No results for input(s): PROBNP in the last 8760 hours.  CBG: Recent Labs  Lab 01/05/21 0951  GLUCAP 130*       Signed:  Florencia Reasons MD, PhD, FACP  [facility] 01/05/2021, 10:14 AM

## 2021-01-05 NOTE — Op Note (Signed)
[facility] Patient Name: Diane Weiss Procedure Date: 01/05/2021 7:19 AM MRN: [id number] Account #: [id number] Date of Birth: 1951-03-08 Admit Type: Inpatient Age: 70 Room: [facility] ENDO ROOM 4 Gender: Female Note Status: Finalized Procedure:             Upper GI endoscopy Indications:           Acute post hemorrhagic anemia, Melena Providers:             Lin Landsman MD, MD Referring MD:          Irven Easterly. Kary Kos, MD (Referring MD) Medicines:             General Anesthesia Complications:         No immediate complications. Estimated blood loss: None. Procedure:             Pre-Anesthesia Assessment:                        - Prior to the procedure, a History and Physical was                         performed, and patient medications and allergies were                         reviewed. The patient is competent. The risks and                         benefits of the procedure and the sedation options and                         risks were discussed with the patient. All questions                         were answered and informed consent was obtained.                         Patient identification and proposed procedure were                         verified by the physician, the nurse, the                         anesthesiologist, the anesthetist and the technician                         in the pre-procedure area in the procedure room in the                         endoscopy suite. Mental Status Examination: alert and                         oriented. Airway Examination: normal oropharyngeal                         airway and neck mobility. Respiratory Examination:                         clear to auscultation. CV Examination: normal.  Prophylactic Antibiotics: The patient does not require                         prophylactic antibiotics. Prior Anticoagulants: The                         patient has taken no previous  anticoagulant or                         antiplatelet agents. ASA Grade Assessment: III - A                         patient with severe systemic disease. After reviewing                         the risks and benefits, the patient was deemed in                         satisfactory condition to undergo the procedure. The                         anesthesia plan was to use general anesthesia.                         Immediately prior to administration of medications,                         the patient was re-assessed for adequacy to receive                         sedatives. The heart rate, respiratory rate, oxygen                         saturations, blood pressure, adequacy of pulmonary                         ventilation, and response to care were monitored                         throughout the procedure. The physical status of the                         patient was re-assessed after the procedure.                        After obtaining informed consent, the endoscope was                         passed under direct vision. Throughout the procedure,                         the patient's blood pressure, pulse, and oxygen                         saturations were monitored continuously. The Endoscope                         was introduced through the mouth, and advanced to the  second part of duodenum. The upper GI endoscopy was                         accomplished without difficulty. The patient tolerated                         the procedure well. Findings:      A small hiatal hernia was present.      Esophagogastric landmarks were identified: the gastroesophageal junction       was found at 40 cm from the incisors.      The gastroesophageal junction and examined esophagus were normal.      Evidence of a patent Billroth II gastrojejunostomy was found. The       gastrojejunal anastomosis was characterized by ulceration. This was       traversed. The efferent limb was  examined. The afferent limb was       examined. Biopsies were taken with a cold forceps for Helicobacter       pylori testing.      The examined duodenum was normal. Impression:            - Small hiatal hernia.                        - Esophagogastric landmarks identified.                        - Normal gastroesophageal junction and esophagus.                        - Patent Billroth II gastrojejunostomy was found,                         characterized by ulceration. Biopsied.                        - Normal examined duodenum. Recommendation:        - Return patient to hospital ward for possible                         discharge same day.                        - Resume regular diet today.                        - Continue present medications.                        - Follow an antireflux regimen.                        - Use Prilosec (omeprazole) 40 mg PO BID for 1 month. Procedure Code(s):     --- Professional ---                        [phone], Esophagogastroduodenoscopy, flexible,                         transoral; with biopsy, single or multiple Diagnosis Code(s):     --- Professional ---  Z98.0, Intestinal bypass and anastomosis status                        K44.9, Diaphragmatic hernia without obstruction or                         gangrene                        D62, Acute posthemorrhagic anemia                        K92.1, Melena (includes Hematochezia) CPT copyright 2019 American Medical Association. All rights reserved. The codes documented in this report are preliminary and upon coder review may  be revised to meet current compliance requirements. Dr. Ulyess Mort Lin Landsman MD, MD 01/05/2021 8:06:14 AM This report has been signed electronically. Number of Addenda: 0 Note Initiated On: 01/05/2021 7:19 AM Estimated Blood Loss:  Estimated blood loss: none.      [facility]

## 2021-01-05 NOTE — Transfer of Care (Signed)
Immediate Anesthesia Transfer of Care Note  Patient: Diane Weiss  Procedure(s) Performed: ESOPHAGOGASTRODUODENOSCOPY (EGD) WITH PROPOFOL (N/A )  Patient Location: PACU and Endoscopy Unit  Anesthesia Type:General  Level of Consciousness: awake  Airway & Oxygen Therapy: Patient Spontanous Breathing  Post-op Assessment: Report given to RN and Post -op Vital signs reviewed and stable  Post vital signs: stable  Last Vitals:  Vitals Value Taken Time  BP    Temp    Pulse 71 01/05/21 0804  Resp 19 01/05/21 0804  SpO2 98 % 01/05/21 0804  Vitals shown include unvalidated device data.  Last Pain:  Vitals:   01/05/21 0713  TempSrc: Temporal  PainSc:          Complications: No complications documented.

## 2021-01-05 NOTE — Anesthesia Postprocedure Evaluation (Signed)
Anesthesia Post Note  Patient: Diane Weiss  Procedure(s) Performed: ESOPHAGOGASTRODUODENOSCOPY (EGD) WITH PROPOFOL (N/A )  Patient location during evaluation: Endoscopy Anesthesia Type: General Level of consciousness: awake and alert Pain management: pain level controlled Vital Signs Assessment: post-procedure vital signs reviewed and stable Respiratory status: spontaneous breathing, nonlabored ventilation, respiratory function stable and patient connected to nasal cannula oxygen Cardiovascular status: blood pressure returned to baseline and stable Postop Assessment: no apparent nausea or vomiting Anesthetic complications: no   No complications documented.   Last Vitals:  Vitals:   01/05/21 0834 01/05/21 0854  BP: (!) 156/66 (!) 150/62  Pulse: 61 63  Resp: 14 18  Temp:  36.8 C  SpO2: 100% 99%    Last Pain:  Vitals:   01/05/21 0834  TempSrc:   PainSc: 0-No pain                 Precious Haws Atiana Levier

## 2021-01-06 ENCOUNTER — Encounter: Payer: Self-pay | Admitting: Gastroenterology

## 2021-01-06 LAB — SURGICAL PATHOLOGY

## 2021-01-11 DIAGNOSIS — D649 Anemia, unspecified: Secondary | ICD-10-CM | POA: Diagnosis not present

## 2021-01-13 DIAGNOSIS — K254 Chronic or unspecified gastric ulcer with hemorrhage: Secondary | ICD-10-CM | POA: Diagnosis not present

## 2021-01-13 DIAGNOSIS — D649 Anemia, unspecified: Secondary | ICD-10-CM | POA: Diagnosis not present

## 2021-01-31 DIAGNOSIS — M7918 Myalgia, other site: Secondary | ICD-10-CM | POA: Diagnosis not present

## 2021-01-31 DIAGNOSIS — Z79899 Other long term (current) drug therapy: Secondary | ICD-10-CM | POA: Diagnosis not present

## 2021-01-31 DIAGNOSIS — M546 Pain in thoracic spine: Secondary | ICD-10-CM | POA: Diagnosis not present

## 2021-01-31 DIAGNOSIS — Z6825 Body mass index (BMI) 25.0-25.9, adult: Secondary | ICD-10-CM | POA: Diagnosis not present

## 2021-01-31 DIAGNOSIS — G894 Chronic pain syndrome: Secondary | ICD-10-CM | POA: Diagnosis not present

## 2021-01-31 DIAGNOSIS — Z79891 Long term (current) use of opiate analgesic: Secondary | ICD-10-CM | POA: Diagnosis not present

## 2021-02-14 ENCOUNTER — Other Ambulatory Visit: Payer: Self-pay

## 2021-02-15 ENCOUNTER — Encounter: Payer: Self-pay | Admitting: Gastroenterology

## 2021-02-15 ENCOUNTER — Other Ambulatory Visit: Payer: Self-pay

## 2021-02-15 ENCOUNTER — Ambulatory Visit: Payer: PPO | Admitting: Gastroenterology

## 2021-02-15 VITALS — BP 142/64 | HR 86 | Temp 98.7°F | Ht 65.0 in | Wt 165.0 lb

## 2021-02-15 DIAGNOSIS — K279 Peptic ulcer, site unspecified, unspecified as acute or chronic, without hemorrhage or perforation: Secondary | ICD-10-CM

## 2021-02-15 DIAGNOSIS — D500 Iron deficiency anemia secondary to blood loss (chronic): Secondary | ICD-10-CM

## 2021-02-15 DIAGNOSIS — D509 Iron deficiency anemia, unspecified: Secondary | ICD-10-CM | POA: Diagnosis not present

## 2021-02-15 NOTE — Progress Notes (Signed)
Cephas Darby, MD [address]  [place]  [place], [place] [address]  Main: [phone]  Fax: [phone]    Gastroenterology Consultation  Referring Provider:     [place] Pink, MD Primary Care Physician:  [place] Pink, MD Primary Gastroenterologist:  Dr. Cephas Darby Reason for Consultation:   Hospital follow-up, peptic ulcer disease        HPI:   Diane Weiss is a 70 y.o. female referred by Dr. [place] Pink, MD  for consultation & management of anastomotic ulcer.  Patient had history of Billroth II gastrectomy was admitted to [facility] secondary to severe iron deficiency anemia and melena.  She underwent upper endoscopy which revealed clean-based ulcer at the anastomosis.  She was discharged on Protonix 40 mg twice daily as well as oral iron.  Her hemoglobin was 9 at the time of discharge.  Patient reports doing well since discharge.  She reports her energy levels are back to normal.  She continues to take 1 iron pill daily.  She denies any GI symptoms today  NSAIDs: None  Antiplts/Anticoagulants/Anti thrombotics: None  GI Procedures:  Patient reports that she is due for colonoscopy in 2024  Upper endoscopy 01/05/2021 - Small hiatal hernia. - Esophagogastric landmarks identified. - Normal gastroesophageal junction and esophagus. - Patent Billroth II gastrojejunostomy was found, characterized by ulceration. Biopsied. - Normal examined duodenum.  DIAGNOSIS:  A. 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  - NEGATIVE FOR H. PYLORI, DYSPLASIA, AND MALIGNANCY.   Past Medical History:  Diagnosis Date  . History of stomach ulcers   . Hyperlipidemia   . Thyroid disease   . Vertigo    no episodes for several years  . Wears dentures    partial lower    Past Surgical History:  Procedure Laterality Date  . CARPAL TUNNEL RELEASE Right 07/07/2016   Procedure: OPEN  CARPAL TUNNEL RELEASE;  Surgeon: Leanor Kail, MD;  Location: [facility];  Service: Orthopedics;  Laterality: Right;  . ESOPHAGOGASTRODUODENOSCOPY (EGD) WITH PROPOFOL N/A 01/05/2021   Procedure: ESOPHAGOGASTRODUODENOSCOPY (EGD) WITH PROPOFOL;  Surgeon: Lin Landsman, MD;  Location: [facility] ENDOSCOPY;  Service: Gastroenterology;  Laterality: N/A;  . KNEE SURGERY  1990  . STOMACH SURGERY  1983   bleeding ulcers    Current Outpatient Medications:  .  baclofen (LIORESAL) 10 MG tablet, Take 10 mg by mouth daily., Disp: , Rfl:  .  Cholecalciferol (D3) 50 MCG (2000 UT) TABS, Take 2,000 Units by mouth at bedtime., Disp: , Rfl:  .  ferrous sulfate 325 (65 FE) MG EC tablet, Take 1 tablet (325 mg total) by mouth daily with breakfast., Disp: 30 tablet, Rfl: 0 .  levothyroxine (SYNTHROID, LEVOTHROID) 75 MCG tablet, Take 75 mcg by mouth daily before breakfast., Disp: , Rfl:  .  lovastatin (ALTOPREV) 40 MG 24 hr tablet, Take 40 mg by mouth at bedtime., Disp: , Rfl:  .  Lutein 20 MG TABS, Take by mouth., Disp: , Rfl:  .  pantoprazole (PROTONIX) 40 MG tablet, Take 1 tablet (40 mg total) by mouth 2 (two) times daily., Disp: 60 tablet, Rfl: 0 .  polyethylene glycol (MIRALAX) 17 g packet, Take 17 g by mouth daily as needed for moderate constipation., Disp: 14 each, Rfl: 0 .  traMADol (ULTRAM) 50 MG tablet, Take 50 mg by mouth 2 (two) times daily., Disp: , Rfl:    Family History  Problem Relation  Age of Onset  . Hypertension Mother   . Diabetes Mother   . Hypertension Father   . Cancer Cousin   . Breast cancer Sister 37  . Breast cancer Maternal Aunt 75  . Breast cancer Paternal Aunt 20     Social History   Tobacco Use  . Smoking status: Former Smoker    Quit date: 09/11/1981    Years since quitting: 39.4  . Smokeless tobacco: Never Used  Substance Use Topics  . Alcohol use: No  . Drug use: No    Allergies as of 02/15/2021  . (No Known Allergies)    Review of Systems:    All  systems reviewed and negative except where noted in HPI.   Physical Exam:  BP (!) 142/64 (BP Location: Left Arm, Patient Position: Sitting, Cuff Size: Normal)   Pulse 86   Temp 98.7 F (37.1 C) (Oral)   Ht 5\' 5"  (1.651 m)   Wt 165 lb (74.8 kg)   BMI 27.46 kg/m  No LMP recorded. Patient is postmenopausal.  General:   Alert,  Well-developed, well-nourished, pleasant and cooperative in NAD Head:  Normocephalic and atraumatic. Eyes:  Sclera clear, no icterus.   Conjunctiva pink. Ears:  Normal auditory acuity. Nose:  No deformity, discharge, or lesions. Mouth:  No deformity or lesions,oropharynx pink & moist. Neck:  Supple; no masses or thyromegaly. Lungs:  Respirations even and unlabored.  Clear throughout to auscultation.   No wheezes, crackles, or rhonchi. No acute distress. Heart:  Regular rate and rhythm; no murmurs, clicks, rubs, or gallops. Abdomen:  Normal bowel sounds. Soft, non-tender and non-distended without masses, hepatosplenomegaly or hernias noted.  No guarding or rebound tenderness.   Rectal: Not performed Msk:  Symmetrical without gross deformities. Good, equal movement & strength bilaterally. Pulses:  Normal pulses noted. Extremities:  No clubbing or edema.  No cyanosis. Neurologic:  Alert and oriented x3;  grossly normal neurologically. Skin:  Intact without significant lesions or rashes. No jaundice. Psych:  Alert and cooperative. Normal mood and affect.  Imaging Studies: None  Assessment and Plan:   Diane Weiss is a 70 y.o. female with history of Billroth II gastrectomy, anastomotic ulcer, iron deficiency anemia.  No evidence of H. pylori based on gastric biopsies  Recheck CBC and iron panel today Continue oral iron daily Continue Protonix 40 mg p.o. twice daily for 2 more months then decrease to 40 mg once a day, then stop   Follow up as needed   Cephas Darby, MD

## 2021-02-16 ENCOUNTER — Encounter: Payer: Self-pay | Admitting: Gastroenterology

## 2021-02-16 LAB — CBC
Hematocrit: 38.5 % (ref 34.0–46.6)
Hemoglobin: 11.8 g/dL (ref 11.1–15.9)
MCH: 26.5 pg — ABNORMAL LOW (ref 26.6–33.0)
MCHC: 30.6 g/dL — ABNORMAL LOW (ref 31.5–35.7)
MCV: 86 fL (ref 79–97)
Platelets: 242 10*3/uL (ref 150–450)
RBC: 4.46 x10E6/uL (ref 3.77–5.28)
RDW: 14.9 % (ref 11.7–15.4)
WBC: 5.2 10*3/uL (ref 3.4–10.8)

## 2021-02-16 LAB — IRON,TIBC AND FERRITIN PANEL
Ferritin: 17 ng/mL (ref 15–150)
Iron Saturation: 8 % — CL (ref 15–55)
Iron: 34 ug/dL (ref 27–139)
Total Iron Binding Capacity: 425 ug/dL (ref 250–450)
UIBC: 391 ug/dL — ABNORMAL HIGH (ref 118–369)

## 2021-06-14 DIAGNOSIS — H25813 Combined forms of age-related cataract, bilateral: Secondary | ICD-10-CM | POA: Diagnosis not present

## 2021-08-01 DIAGNOSIS — M7918 Myalgia, other site: Secondary | ICD-10-CM | POA: Diagnosis not present

## 2021-08-01 DIAGNOSIS — M546 Pain in thoracic spine: Secondary | ICD-10-CM | POA: Diagnosis not present

## 2021-08-01 DIAGNOSIS — G894 Chronic pain syndrome: Secondary | ICD-10-CM | POA: Diagnosis not present

## 2021-08-01 DIAGNOSIS — Z79891 Long term (current) use of opiate analgesic: Secondary | ICD-10-CM | POA: Diagnosis not present

## 2021-08-01 DIAGNOSIS — Z79899 Other long term (current) drug therapy: Secondary | ICD-10-CM | POA: Diagnosis not present

## 2021-10-12 DIAGNOSIS — M9901 Segmental and somatic dysfunction of cervical region: Secondary | ICD-10-CM | POA: Diagnosis not present

## 2021-10-12 DIAGNOSIS — M9903 Segmental and somatic dysfunction of lumbar region: Secondary | ICD-10-CM | POA: Diagnosis not present

## 2021-10-12 DIAGNOSIS — M9902 Segmental and somatic dysfunction of thoracic region: Secondary | ICD-10-CM | POA: Diagnosis not present

## 2021-10-12 DIAGNOSIS — M546 Pain in thoracic spine: Secondary | ICD-10-CM | POA: Diagnosis not present

## 2021-10-21 DIAGNOSIS — M546 Pain in thoracic spine: Secondary | ICD-10-CM | POA: Diagnosis not present

## 2021-10-21 DIAGNOSIS — M9903 Segmental and somatic dysfunction of lumbar region: Secondary | ICD-10-CM | POA: Diagnosis not present

## 2021-10-21 DIAGNOSIS — M9902 Segmental and somatic dysfunction of thoracic region: Secondary | ICD-10-CM | POA: Diagnosis not present

## 2021-10-21 DIAGNOSIS — M9901 Segmental and somatic dysfunction of cervical region: Secondary | ICD-10-CM | POA: Diagnosis not present

## 2021-11-04 DIAGNOSIS — M9901 Segmental and somatic dysfunction of cervical region: Secondary | ICD-10-CM | POA: Diagnosis not present

## 2021-11-04 DIAGNOSIS — M9902 Segmental and somatic dysfunction of thoracic region: Secondary | ICD-10-CM | POA: Diagnosis not present

## 2021-11-04 DIAGNOSIS — M546 Pain in thoracic spine: Secondary | ICD-10-CM | POA: Diagnosis not present

## 2021-11-04 DIAGNOSIS — M9903 Segmental and somatic dysfunction of lumbar region: Secondary | ICD-10-CM | POA: Diagnosis not present

## 2021-11-22 DIAGNOSIS — H903 Sensorineural hearing loss, bilateral: Secondary | ICD-10-CM | POA: Diagnosis not present

## 2021-11-28 DIAGNOSIS — Z0000 Encounter for general adult medical examination without abnormal findings: Secondary | ICD-10-CM | POA: Diagnosis not present

## 2021-11-28 DIAGNOSIS — E039 Hypothyroidism, unspecified: Secondary | ICD-10-CM | POA: Diagnosis not present

## 2021-11-28 DIAGNOSIS — E785 Hyperlipidemia, unspecified: Secondary | ICD-10-CM | POA: Diagnosis not present

## 2021-12-05 ENCOUNTER — Other Ambulatory Visit: Payer: Self-pay | Admitting: Family Medicine

## 2021-12-05 DIAGNOSIS — E785 Hyperlipidemia, unspecified: Secondary | ICD-10-CM | POA: Diagnosis not present

## 2021-12-05 DIAGNOSIS — B070 Plantar wart: Secondary | ICD-10-CM | POA: Diagnosis not present

## 2021-12-05 DIAGNOSIS — E039 Hypothyroidism, unspecified: Secondary | ICD-10-CM | POA: Diagnosis not present

## 2021-12-05 DIAGNOSIS — D649 Anemia, unspecified: Secondary | ICD-10-CM | POA: Diagnosis not present

## 2021-12-05 DIAGNOSIS — Z0000 Encounter for general adult medical examination without abnormal findings: Secondary | ICD-10-CM | POA: Diagnosis not present

## 2021-12-05 DIAGNOSIS — M25561 Pain in right knee: Secondary | ICD-10-CM | POA: Diagnosis not present

## 2021-12-06 ENCOUNTER — Other Ambulatory Visit: Payer: Self-pay | Admitting: Family Medicine

## 2021-12-06 DIAGNOSIS — Z1231 Encounter for screening mammogram for malignant neoplasm of breast: Secondary | ICD-10-CM

## 2022-01-10 ENCOUNTER — Ambulatory Visit
Admission: RE | Admit: 2022-01-10 | Discharge: 2022-01-10 | Disposition: A | Discharge status: Home / Self Care | Payer: PPO | Source: Ambulatory Visit | Attending: Family Medicine | Admitting: Family Medicine

## 2022-01-10 ENCOUNTER — Ambulatory Visit: Payer: PPO | Admitting: Podiatry

## 2022-01-10 DIAGNOSIS — L989 Disorder of the skin and subcutaneous tissue, unspecified: Secondary | ICD-10-CM | POA: Diagnosis not present

## 2022-01-10 DIAGNOSIS — Z1231 Encounter for screening mammogram for malignant neoplasm of breast: Secondary | ICD-10-CM | POA: Insufficient documentation

## 2022-01-10 NOTE — Progress Notes (Signed)
? ?  Subjective: ?71 y.o. female presenting to the office today for evaluation of an skin lesion to the plantar aspect of the fifth MTP joint right foot.  Patient states that she noticed it for the about a month now.  It is become very tender and it feels as if she is walking on a pebble in her shoe.  She has not done anything for treatment. ? ? ?Past Medical History:  ?Diagnosis Date  ? Acute blood loss anemia   ? GI bleeding 01/04/2021  ? History of stomach ulcers   ? Hyperlipidemia   ? Symptomatic anemia   ? Thyroid disease   ? Vertigo   ? no episodes for several years  ? Wears dentures   ? partial lower  ? ? ? ?Objective:  ?Physical Exam ?General: Alert and oriented x3 in no acute distress ? ?Dermatology: Hyperkeratotic lesion(s) present on the plantar aspect of the fifth MTP right. Pain on palpation with a central nucleated core noted. Skin is warm, dry and supple bilateral lower extremities. Negative for open lesions or macerations. ? ?Vascular: Palpable pedal pulses bilaterally. No edema or erythema noted. Capillary refill within normal limits. ? ?Neurological: Epicritic and protective threshold grossly intact bilaterally.  ? ?Musculoskeletal Exam: Pain on palpation at the keratotic lesion(s) noted. Range of motion within normal limits bilateral. Muscle strength 5/5 in all groups bilateral. ? ?Assessment: ?1.  Porokeratosis/IPK fifth MTP right ? ? ?Plan of Care:  ?1. Patient evaluated ?2. Excisional debridement of keratoic lesion(s) using a chisel blade was performed without incident.  ?3.  Recommend OTC corn callus remover daily ?4. Patient is to return to the clinic PRN.  ? ?Edrick Kins, DPM ?[facility] ? ?Dr. Edrick Kins, DPM  ?  ?[address]                                       ?[place], [place] [address]                ?Office [phone]  ?Fax [phone] ? ? ? ? ?

## 2022-01-27 IMAGING — DX DG CHEST 1V PORT
1 series · 1 of 1 positions shown · non-contrast
Comparison: None.

CLINICAL DATA: Chest pain

EXAM:
PORTABLE CHEST 1 VIEW

[chest ap]
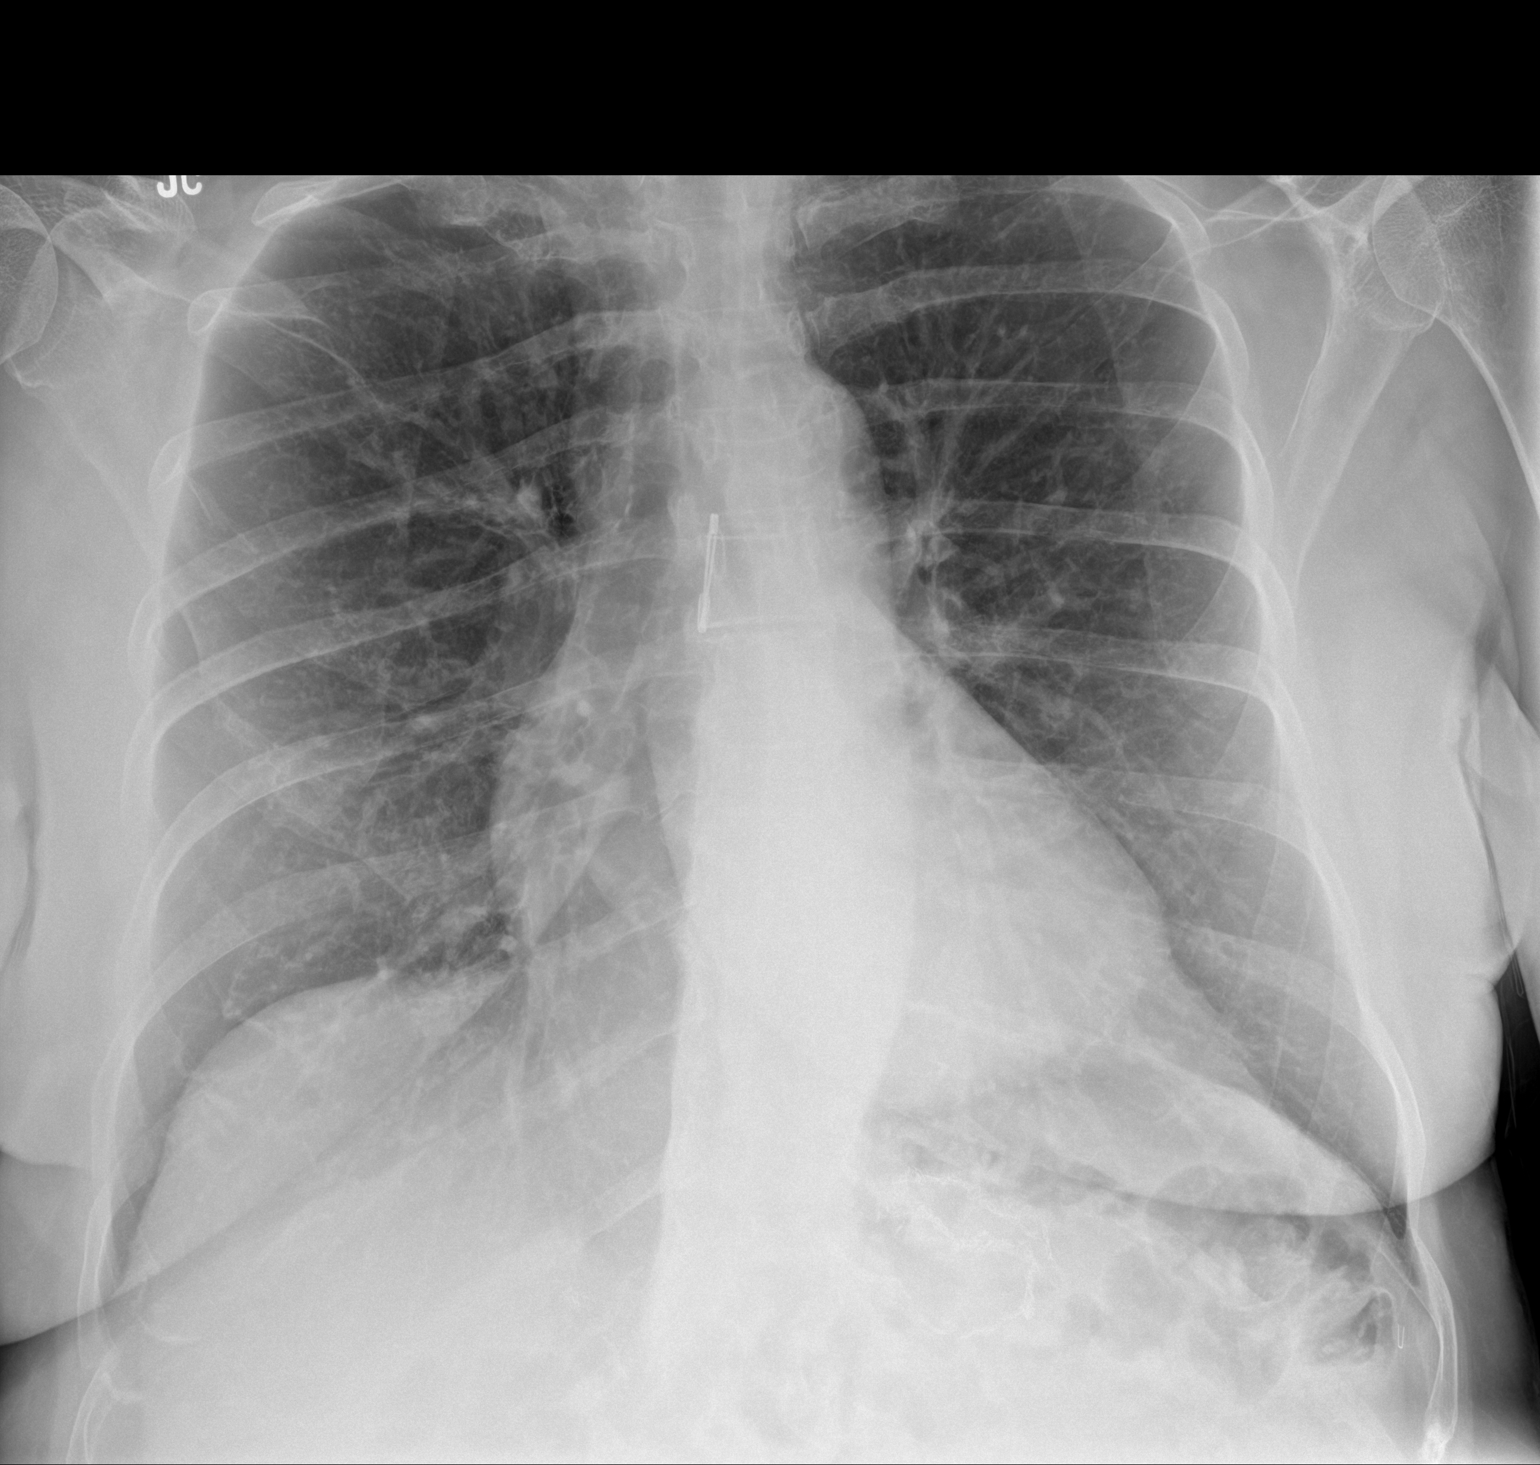

[1 of 1 positions shown; findings below may reference images not displayed]

FINDINGS: A safety pin presumably overlies the midline mid chest region. There
is slight scarring in the right upper lobe and right base regions.
No edema or airspace opacity. Heart size and pulmonary vascularity
are normal. No adenopathy. No pneumothorax. No bone lesions.
IMPRESSION: Safety pin presumably overlies the midline chest region. No edema or
airspace opacity. Areas of mild scarring noted on the right. Heart
size within normal limits.

## 2022-01-30 DIAGNOSIS — M9902 Segmental and somatic dysfunction of thoracic region: Secondary | ICD-10-CM | POA: Diagnosis not present

## 2022-01-30 DIAGNOSIS — M9903 Segmental and somatic dysfunction of lumbar region: Secondary | ICD-10-CM | POA: Diagnosis not present

## 2022-01-30 DIAGNOSIS — M9901 Segmental and somatic dysfunction of cervical region: Secondary | ICD-10-CM | POA: Diagnosis not present

## 2022-01-30 DIAGNOSIS — M546 Pain in thoracic spine: Secondary | ICD-10-CM | POA: Diagnosis not present

## 2022-02-03 DIAGNOSIS — M546 Pain in thoracic spine: Secondary | ICD-10-CM | POA: Diagnosis not present

## 2022-02-03 DIAGNOSIS — M9902 Segmental and somatic dysfunction of thoracic region: Secondary | ICD-10-CM | POA: Diagnosis not present

## 2022-02-03 DIAGNOSIS — M9901 Segmental and somatic dysfunction of cervical region: Secondary | ICD-10-CM | POA: Diagnosis not present

## 2022-02-03 DIAGNOSIS — M9903 Segmental and somatic dysfunction of lumbar region: Secondary | ICD-10-CM | POA: Diagnosis not present

## 2022-02-23 DIAGNOSIS — G894 Chronic pain syndrome: Secondary | ICD-10-CM | POA: Diagnosis not present

## 2022-02-23 DIAGNOSIS — M7918 Myalgia, other site: Secondary | ICD-10-CM | POA: Diagnosis not present

## 2022-02-23 DIAGNOSIS — M546 Pain in thoracic spine: Secondary | ICD-10-CM | POA: Diagnosis not present

## 2022-03-19 DIAGNOSIS — R0781 Pleurodynia: Secondary | ICD-10-CM | POA: Diagnosis not present

## 2022-03-19 DIAGNOSIS — M25551 Pain in right hip: Secondary | ICD-10-CM | POA: Diagnosis not present

## 2022-03-20 DIAGNOSIS — M9901 Segmental and somatic dysfunction of cervical region: Secondary | ICD-10-CM | POA: Diagnosis not present

## 2022-03-20 DIAGNOSIS — M546 Pain in thoracic spine: Secondary | ICD-10-CM | POA: Diagnosis not present

## 2022-03-20 DIAGNOSIS — M9903 Segmental and somatic dysfunction of lumbar region: Secondary | ICD-10-CM | POA: Diagnosis not present

## 2022-03-20 DIAGNOSIS — M9902 Segmental and somatic dysfunction of thoracic region: Secondary | ICD-10-CM | POA: Diagnosis not present

## 2022-03-27 DIAGNOSIS — M546 Pain in thoracic spine: Secondary | ICD-10-CM | POA: Diagnosis not present

## 2022-03-27 DIAGNOSIS — M9903 Segmental and somatic dysfunction of lumbar region: Secondary | ICD-10-CM | POA: Diagnosis not present

## 2022-03-27 DIAGNOSIS — M9901 Segmental and somatic dysfunction of cervical region: Secondary | ICD-10-CM | POA: Diagnosis not present

## 2022-03-27 DIAGNOSIS — M9902 Segmental and somatic dysfunction of thoracic region: Secondary | ICD-10-CM | POA: Diagnosis not present

## 2022-03-31 DIAGNOSIS — M9902 Segmental and somatic dysfunction of thoracic region: Secondary | ICD-10-CM | POA: Diagnosis not present

## 2022-03-31 DIAGNOSIS — M9901 Segmental and somatic dysfunction of cervical region: Secondary | ICD-10-CM | POA: Diagnosis not present

## 2022-03-31 DIAGNOSIS — M9903 Segmental and somatic dysfunction of lumbar region: Secondary | ICD-10-CM | POA: Diagnosis not present

## 2022-03-31 DIAGNOSIS — M546 Pain in thoracic spine: Secondary | ICD-10-CM | POA: Diagnosis not present

## 2022-04-19 DIAGNOSIS — M546 Pain in thoracic spine: Secondary | ICD-10-CM | POA: Diagnosis not present

## 2022-04-19 DIAGNOSIS — M9902 Segmental and somatic dysfunction of thoracic region: Secondary | ICD-10-CM | POA: Diagnosis not present

## 2022-04-19 DIAGNOSIS — M9901 Segmental and somatic dysfunction of cervical region: Secondary | ICD-10-CM | POA: Diagnosis not present

## 2022-04-19 DIAGNOSIS — M9903 Segmental and somatic dysfunction of lumbar region: Secondary | ICD-10-CM | POA: Diagnosis not present

## 2022-04-21 DIAGNOSIS — M546 Pain in thoracic spine: Secondary | ICD-10-CM | POA: Diagnosis not present

## 2022-04-21 DIAGNOSIS — M9903 Segmental and somatic dysfunction of lumbar region: Secondary | ICD-10-CM | POA: Diagnosis not present

## 2022-04-21 DIAGNOSIS — M9902 Segmental and somatic dysfunction of thoracic region: Secondary | ICD-10-CM | POA: Diagnosis not present

## 2022-04-21 DIAGNOSIS — M9901 Segmental and somatic dysfunction of cervical region: Secondary | ICD-10-CM | POA: Diagnosis not present

## 2022-04-24 DIAGNOSIS — M9901 Segmental and somatic dysfunction of cervical region: Secondary | ICD-10-CM | POA: Diagnosis not present

## 2022-04-24 DIAGNOSIS — M9903 Segmental and somatic dysfunction of lumbar region: Secondary | ICD-10-CM | POA: Diagnosis not present

## 2022-04-24 DIAGNOSIS — M9902 Segmental and somatic dysfunction of thoracic region: Secondary | ICD-10-CM | POA: Diagnosis not present

## 2022-04-24 DIAGNOSIS — M546 Pain in thoracic spine: Secondary | ICD-10-CM | POA: Diagnosis not present

## 2022-04-28 DIAGNOSIS — M9903 Segmental and somatic dysfunction of lumbar region: Secondary | ICD-10-CM | POA: Diagnosis not present

## 2022-04-28 DIAGNOSIS — M546 Pain in thoracic spine: Secondary | ICD-10-CM | POA: Diagnosis not present

## 2022-04-28 DIAGNOSIS — M9901 Segmental and somatic dysfunction of cervical region: Secondary | ICD-10-CM | POA: Diagnosis not present

## 2022-04-28 DIAGNOSIS — M9902 Segmental and somatic dysfunction of thoracic region: Secondary | ICD-10-CM | POA: Diagnosis not present

## 2022-05-03 DIAGNOSIS — M9903 Segmental and somatic dysfunction of lumbar region: Secondary | ICD-10-CM | POA: Diagnosis not present

## 2022-05-03 DIAGNOSIS — M546 Pain in thoracic spine: Secondary | ICD-10-CM | POA: Diagnosis not present

## 2022-05-03 DIAGNOSIS — M9902 Segmental and somatic dysfunction of thoracic region: Secondary | ICD-10-CM | POA: Diagnosis not present

## 2022-05-03 DIAGNOSIS — M9901 Segmental and somatic dysfunction of cervical region: Secondary | ICD-10-CM | POA: Diagnosis not present

## 2022-05-15 DIAGNOSIS — M9901 Segmental and somatic dysfunction of cervical region: Secondary | ICD-10-CM | POA: Diagnosis not present

## 2022-05-15 DIAGNOSIS — M546 Pain in thoracic spine: Secondary | ICD-10-CM | POA: Diagnosis not present

## 2022-05-15 DIAGNOSIS — M9902 Segmental and somatic dysfunction of thoracic region: Secondary | ICD-10-CM | POA: Diagnosis not present

## 2022-05-15 DIAGNOSIS — M9903 Segmental and somatic dysfunction of lumbar region: Secondary | ICD-10-CM | POA: Diagnosis not present

## 2022-05-29 DIAGNOSIS — M9902 Segmental and somatic dysfunction of thoracic region: Secondary | ICD-10-CM | POA: Diagnosis not present

## 2022-05-29 DIAGNOSIS — M546 Pain in thoracic spine: Secondary | ICD-10-CM | POA: Diagnosis not present

## 2022-05-29 DIAGNOSIS — M9903 Segmental and somatic dysfunction of lumbar region: Secondary | ICD-10-CM | POA: Diagnosis not present

## 2022-05-29 DIAGNOSIS — M9901 Segmental and somatic dysfunction of cervical region: Secondary | ICD-10-CM | POA: Diagnosis not present

## 2022-06-01 DIAGNOSIS — G8929 Other chronic pain: Secondary | ICD-10-CM | POA: Diagnosis not present

## 2022-06-01 DIAGNOSIS — M1731 Unilateral post-traumatic osteoarthritis, right knee: Secondary | ICD-10-CM | POA: Diagnosis not present

## 2022-06-26 DIAGNOSIS — H25813 Combined forms of age-related cataract, bilateral: Secondary | ICD-10-CM | POA: Diagnosis not present

## 2022-07-31 DIAGNOSIS — M9901 Segmental and somatic dysfunction of cervical region: Secondary | ICD-10-CM | POA: Diagnosis not present

## 2022-07-31 DIAGNOSIS — M546 Pain in thoracic spine: Secondary | ICD-10-CM | POA: Diagnosis not present

## 2022-07-31 DIAGNOSIS — M9903 Segmental and somatic dysfunction of lumbar region: Secondary | ICD-10-CM | POA: Diagnosis not present

## 2022-07-31 DIAGNOSIS — M9902 Segmental and somatic dysfunction of thoracic region: Secondary | ICD-10-CM | POA: Diagnosis not present

## 2022-08-02 DIAGNOSIS — M9901 Segmental and somatic dysfunction of cervical region: Secondary | ICD-10-CM | POA: Diagnosis not present

## 2022-08-02 DIAGNOSIS — M9903 Segmental and somatic dysfunction of lumbar region: Secondary | ICD-10-CM | POA: Diagnosis not present

## 2022-08-02 DIAGNOSIS — M9902 Segmental and somatic dysfunction of thoracic region: Secondary | ICD-10-CM | POA: Diagnosis not present

## 2022-08-02 DIAGNOSIS — M546 Pain in thoracic spine: Secondary | ICD-10-CM | POA: Diagnosis not present

## 2022-08-14 DIAGNOSIS — M9901 Segmental and somatic dysfunction of cervical region: Secondary | ICD-10-CM | POA: Diagnosis not present

## 2022-08-14 DIAGNOSIS — M9902 Segmental and somatic dysfunction of thoracic region: Secondary | ICD-10-CM | POA: Diagnosis not present

## 2022-08-14 DIAGNOSIS — M546 Pain in thoracic spine: Secondary | ICD-10-CM | POA: Diagnosis not present

## 2022-08-14 DIAGNOSIS — M9903 Segmental and somatic dysfunction of lumbar region: Secondary | ICD-10-CM | POA: Diagnosis not present

## 2022-08-25 DIAGNOSIS — Z79891 Long term (current) use of opiate analgesic: Secondary | ICD-10-CM | POA: Diagnosis not present

## 2022-08-25 DIAGNOSIS — Z79899 Other long term (current) drug therapy: Secondary | ICD-10-CM | POA: Diagnosis not present

## 2022-08-25 DIAGNOSIS — G894 Chronic pain syndrome: Secondary | ICD-10-CM | POA: Diagnosis not present

## 2022-08-25 DIAGNOSIS — M7918 Myalgia, other site: Secondary | ICD-10-CM | POA: Diagnosis not present

## 2022-08-25 DIAGNOSIS — M546 Pain in thoracic spine: Secondary | ICD-10-CM | POA: Diagnosis not present

## 2022-12-08 ENCOUNTER — Other Ambulatory Visit: Payer: Self-pay

## 2022-12-08 DIAGNOSIS — Z1231 Encounter for screening mammogram for malignant neoplasm of breast: Secondary | ICD-10-CM

## 2023-01-12 ENCOUNTER — Ambulatory Visit
Admission: RE | Admit: 2023-01-12 | Discharge: 2023-01-12 | Disposition: A | Discharge status: Home / Self Care | Payer: Medicare HMO | Source: Ambulatory Visit | Attending: Family Medicine | Admitting: Family Medicine

## 2023-01-12 DIAGNOSIS — Z1231 Encounter for screening mammogram for malignant neoplasm of breast: Secondary | ICD-10-CM | POA: Diagnosis present

## 2023-02-02 IMAGING — MG MM DIGITAL SCREENING BILAT W/ TOMO AND CAD
8 series · 8 of 24 positions shown · non-contrast
Comparison: None Available.

CLINICAL DATA: Screening.

EXAM:
DIGITAL SCREENING BILATERAL MAMMOGRAM WITH TOMOSYNTHESIS AND CAD
TECHNIQUE: Bilateral screening digital craniocaudal and mediolateral oblique
mammograms were obtained. Bilateral screening digital breast
tomosynthesis was performed. The images were evaluated with
computer-aided detection.

[R MLO synth-2D]
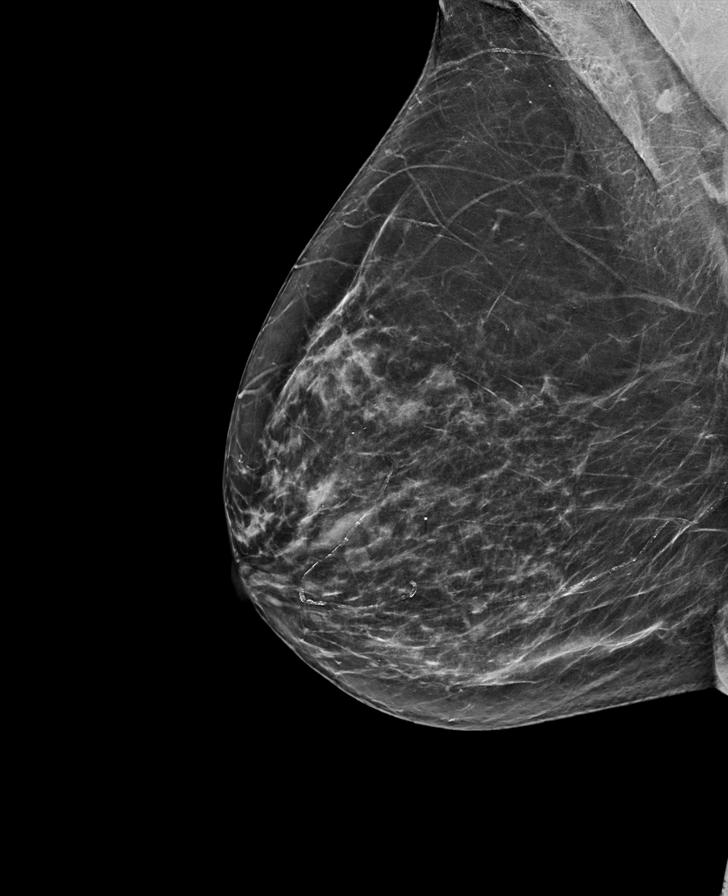

[L CC synth-2D]
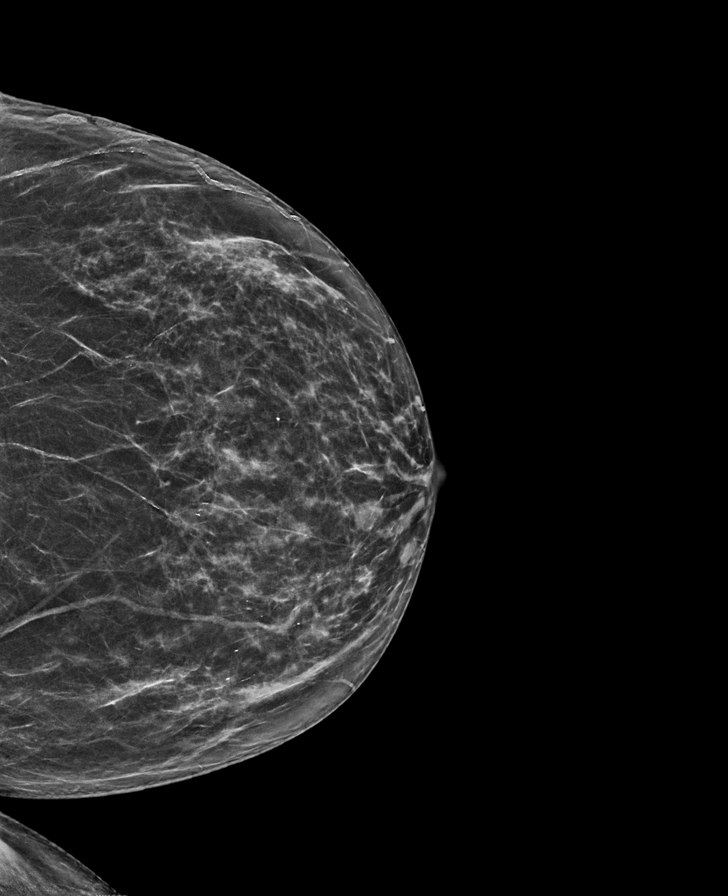

[R CC synth-2D]
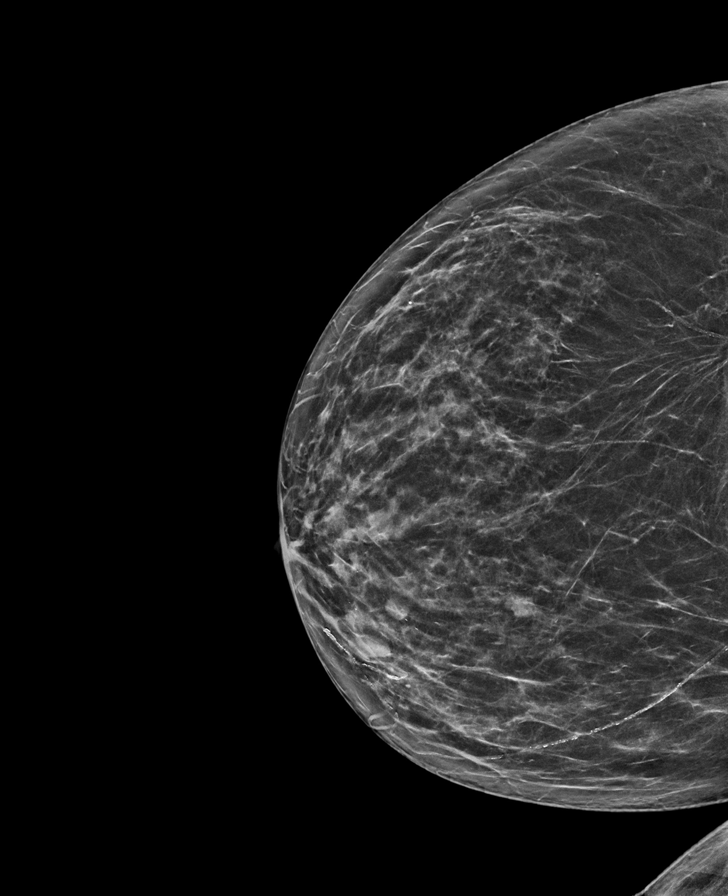

[L MLO synth-2D]
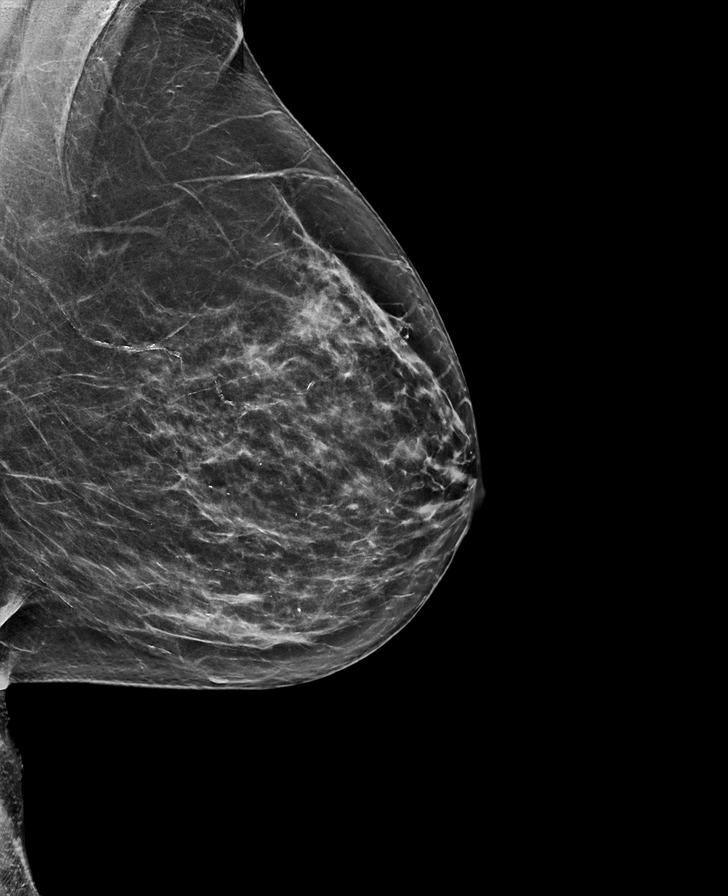

[R CC tomo · tomo slice 37/73.0]
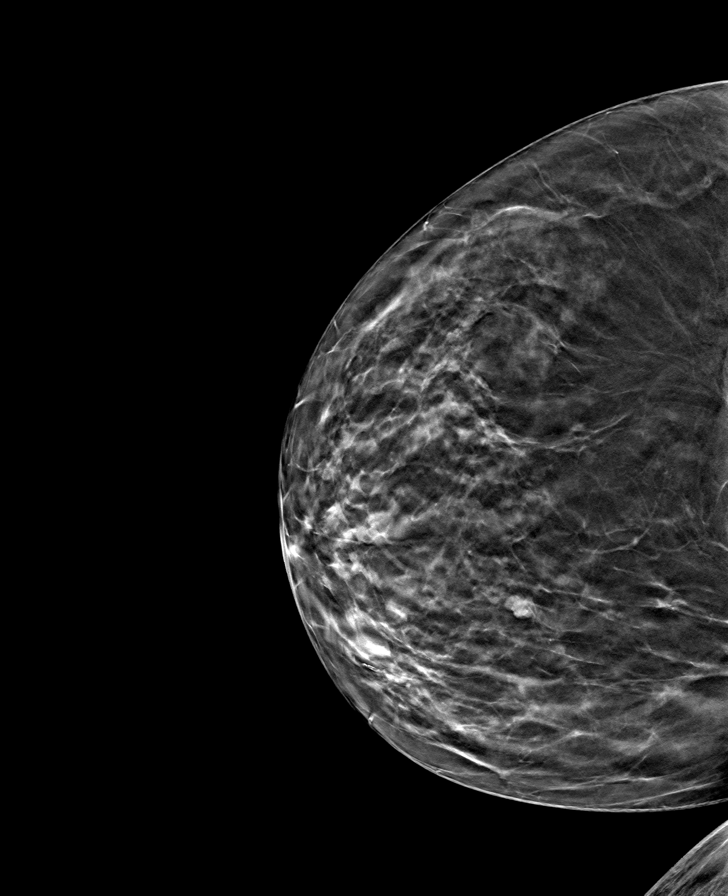

[L CC tomo · tomo slice 35/70.0]
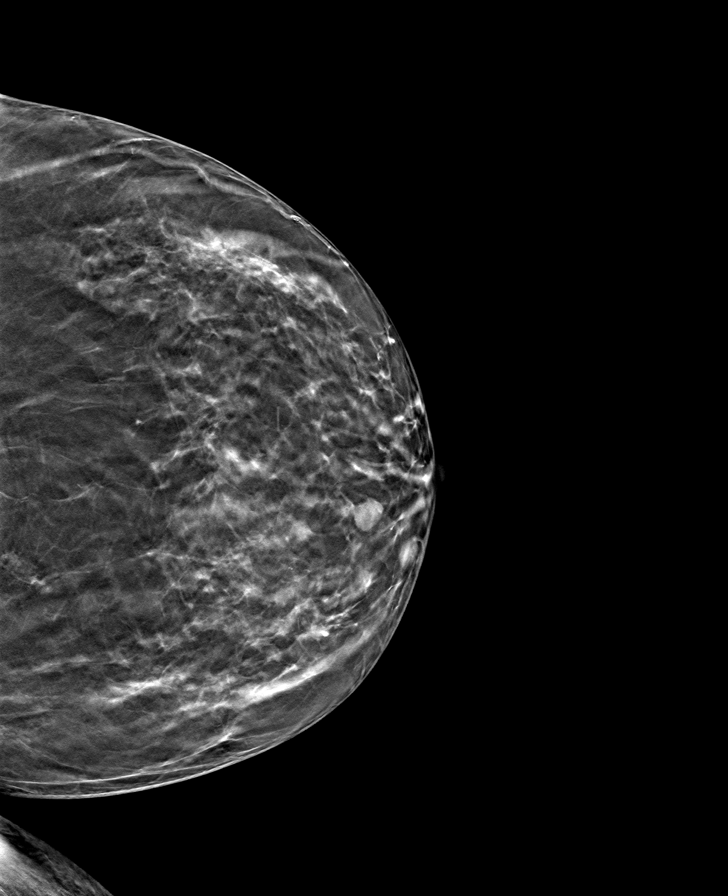

[R MLO tomo · tomo slice 40/79.0]
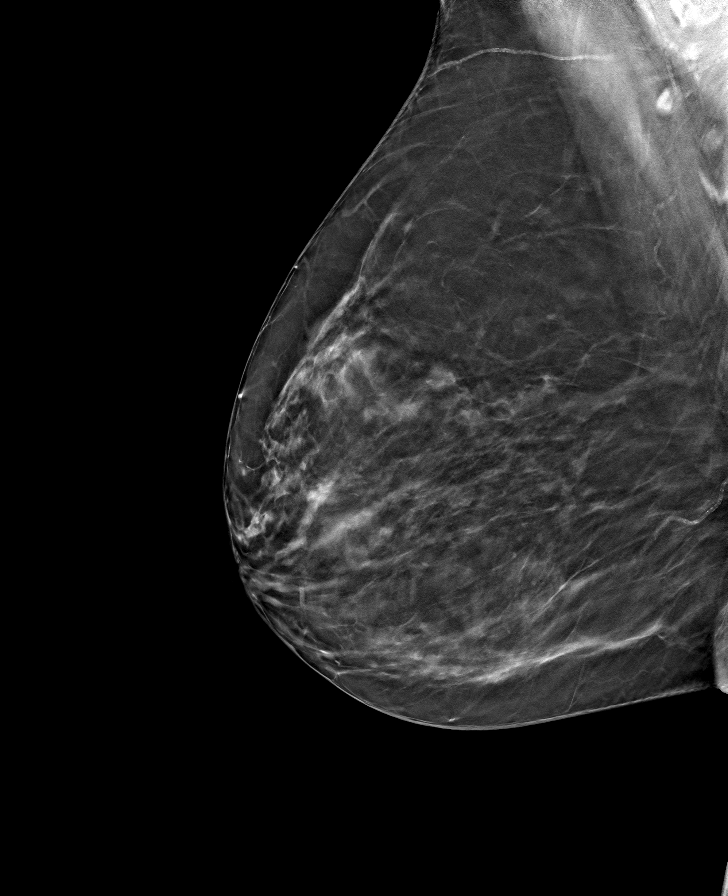

[L MLO tomo · tomo slice 41/80.0]
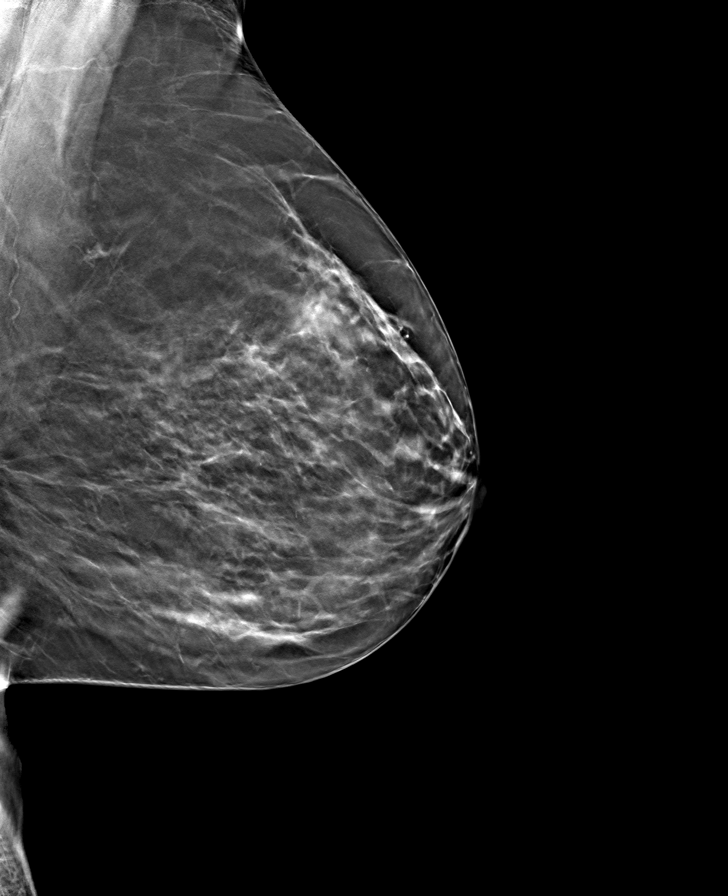

[8 of 24 positions shown; findings below may reference images not displayed]

ACR Breast Density Category c: The breast tissue is heterogeneously
dense, which may obscure small masses.
FINDINGS: There are no findings suspicious for malignancy.
IMPRESSION: No mammographic evidence of malignancy. A result letter of this
screening mammogram will be mailed directly to the patient.

RECOMMENDATION:
Screening mammogram in one year. (Code:BD-A-CHL)

BI-RADS CATEGORY  1: Negative.

## 2023-05-15 ENCOUNTER — Other Ambulatory Visit: Payer: Self-pay

## 2023-05-15 ENCOUNTER — Encounter (HOSPITAL_COMMUNITY): Payer: Self-pay | Admitting: *Deleted

## 2023-05-15 ENCOUNTER — Emergency Department (HOSPITAL_COMMUNITY)
Admission: EM | Admit: 2023-05-15 | Discharge: 2023-05-15 | Disposition: A | Discharge status: Home / Self Care | Payer: Medicare HMO | Attending: Emergency Medicine | Admitting: Emergency Medicine

## 2023-05-15 ENCOUNTER — Emergency Department (HOSPITAL_COMMUNITY): Payer: Medicare HMO

## 2023-05-15 DIAGNOSIS — K279 Peptic ulcer, site unspecified, unspecified as acute or chronic, without hemorrhage or perforation: Secondary | ICD-10-CM | POA: Insufficient documentation

## 2023-05-15 DIAGNOSIS — R531 Weakness: Secondary | ICD-10-CM | POA: Diagnosis not present

## 2023-05-15 LAB — CBC WITH DIFFERENTIAL/PLATELET
Abs Immature Granulocytes: 0.04 10*3/uL (ref 0.00–0.07)
Basophils Absolute: 0 10*3/uL (ref 0.0–0.1)
Basophils Relative: 0 %
Eosinophils Absolute: 0 10*3/uL (ref 0.0–0.5)
Eosinophils Relative: 0 %
HCT: 38.4 % (ref 36.0–46.0)
Hemoglobin: 12.3 g/dL (ref 12.0–15.0)
Immature Granulocytes: 0 %
Lymphocytes Relative: 13 %
Lymphs Abs: 1.5 10*3/uL (ref 0.7–4.0)
MCH: 30.3 pg (ref 26.0–34.0)
MCHC: 32 g/dL (ref 30.0–36.0)
MCV: 94.6 fL (ref 80.0–100.0)
Monocytes Absolute: 0.6 10*3/uL (ref 0.1–1.0)
Monocytes Relative: 5 %
Neutro Abs: 9.5 10*3/uL — ABNORMAL HIGH (ref 1.7–7.7)
Neutrophils Relative %: 82 %
Platelets: 168 10*3/uL (ref 150–400)
RBC: 4.06 MIL/uL (ref 3.87–5.11)
RDW: 13.1 % (ref 11.5–15.5)
WBC: 11.7 10*3/uL — ABNORMAL HIGH (ref 4.0–10.5)
nRBC: 0 % (ref 0.0–0.2)

## 2023-05-15 LAB — COMPREHENSIVE METABOLIC PANEL
ALT: 18 U/L (ref 0–44)
AST: 19 U/L (ref 15–41)
Albumin: 3.8 g/dL (ref 3.5–5.0)
Alkaline Phosphatase: 40 U/L (ref 38–126)
Anion gap: 9 (ref 5–15)
BUN: 29 mg/dL — ABNORMAL HIGH (ref 8–23)
CO2: 22 mmol/L (ref 22–32)
Calcium: 8.6 mg/dL — ABNORMAL LOW (ref 8.9–10.3)
Chloride: 102 mmol/L (ref 98–111)
Creatinine, Ser: 0.62 mg/dL (ref 0.44–1.00)
GFR, Estimated: >60 mL/min (ref >60)
Glucose, Bld: 163 mg/dL — ABNORMAL HIGH (ref 70–99)
Potassium: 4 mmol/L (ref 3.5–5.1)
Sodium: 133 mmol/L — ABNORMAL LOW (ref 135–145)
Total Bilirubin: 0.5 mg/dL (ref 0.3–1.2)
Total Protein: 6.3 g/dL — ABNORMAL LOW (ref 6.5–8.1)

## 2023-05-15 LAB — TYPE AND SCREEN
ABO/RH(D): A POS
Antibody Screen: NEGATIVE

## 2023-05-15 LAB — URINALYSIS, ROUTINE W REFLEX MICROSCOPIC
Bilirubin Urine: NEGATIVE
Glucose, UA: NEGATIVE mg/dL
Hgb urine dipstick: NEGATIVE
Ketones, ur: NEGATIVE mg/dL
Leukocytes,Ua: NEGATIVE
Nitrite: NEGATIVE
Protein, ur: NEGATIVE mg/dL
Specific Gravity, Urine: 1.018 (ref 1.005–1.030)
pH: 5 (ref 5.0–8.0)

## 2023-05-15 LAB — PROTIME-INR
INR: 1 (ref 0.8–1.2)
Prothrombin Time: 12.9 s (ref 11.4–15.2)

## 2023-05-15 MED ORDER — SUCRALFATE 1 G PO TABS: 1.0000 g | ORAL_TABLET | Freq: Three times a day (TID) | ORAL | 0 refills | Status: AC

## 2023-05-15 MED ORDER — OMEPRAZOLE 20 MG PO CPDR: 40.0000 mg | DELAYED_RELEASE_CAPSULE | Freq: Every day | ORAL | 0 refills | Status: DC

## 2023-05-15 MED ORDER — PANTOPRAZOLE SODIUM 40 MG IV SOLR
40.0000 mg | Freq: Once | INTRAVENOUS | Status: AC
  Administered 2023-05-15: 40 mg via INTRAVENOUS
  Filled 2023-05-15: qty 10

## 2023-05-15 MED ORDER — IOHEXOL 300 MG/ML  SOLN
100.0000 mL | Freq: Once | INTRAMUSCULAR | Status: AC | PRN
  Administered 2023-05-15: 100 mL via INTRAVENOUS

## 2023-05-15 NOTE — Discharge Instructions (Signed)
I would like you to take 40 mg of omeprazole each day for the next 1 month.  You can also take Carafate 3 times per day as this will help out with peptic ulcers.  Would like you to follow-up with your gastroenterologist regarding the symptoms to see if you need an endoscopy.  If you do not have a gastroenterologist, I have included 1 your discharge paperwork.  You should follow-up with your primary care doctor within 1 week.  Come back to the emergency department if develop new or worsening pain in your stomach.

## 2023-05-15 NOTE — ED Triage Notes (Signed)
Pt with c./o generalized weakness x 2 weeks and with dark colored acid reflux today.  Pt with hx of bleeding ulcers.

## 2023-05-15 NOTE — ED Provider Notes (Signed)
[place] [facility] EMERGENCY DEPARTMENT AT [facility] Provider Note   CSN: [id number] Arrival date & time: 05/15/23  1626     History  Chief Complaint  Patient presents with   Weakness    Diane Weiss is a 72 y.o. female.  This is a 72 year old female who is here for weakness.  She says that over the last 2 weeks she has felt weak.  She has had issues with gastric ulcers in the past, and is concerned that this could be going on again.  She is not currently taking a proton pump inhibitor.  Patient says that she has been constipated over the last couple of weeks and has been taking MiraLAX and a stool softener.  She does not take any NSAIDs.   Weakness      Home Medications Prior to Admission medications   Medication Sig Start Date End Date Taking? Authorizing Provider  acetaminophen (TYLENOL) 325 MG tablet Take 650 mg by mouth daily as needed for mild pain or headache.   Yes [provider]  baclofen (LIORESAL) 10 MG tablet Take 10 mg by mouth daily.   Yes [provider]  Cholecalciferol (D3) 50 MCG (2000 UT) TABS Take 2,000 Units by mouth at bedtime.   Yes [provider]  levothyroxine (SYNTHROID, LEVOTHROID) 75 MCG tablet Take 75 mcg by mouth daily before breakfast.   Yes [provider]  lovastatin (ALTOPREV) 40 MG 24 hr tablet Take 40 mg by mouth at bedtime.   Yes [provider]  Lutein 20 MG TABS Take 20 mg by mouth daily.   Yes [provider]  Multiple Vitamin (MULTI-VITAMIN) tablet Take 1 tablet by mouth daily.   Yes [provider]  Na Sulfate-K Sulfate-Mg Sulf 17.5-3.13-1.6 GM/177ML SOLN Take 1 Bottle by mouth as directed. 04/12/23  Yes [provider]  omeprazole (PRILOSEC) 20 MG capsule Take 2 capsules (40 mg total) by mouth daily. 05/15/23 06/14/23 Yes Anders Simmonds T, DO  polyethylene glycol (MIRALAX) 17 g packet Take 17 g by mouth daily as needed for moderate constipation. 01/05/21  Yes Albertine Grates, MD  sucralfate (CARAFATE) 1 g tablet Take 1 tablet (1 g total) by mouth 4 (four) times daily -  with meals and at bedtime. 05/15/23 06/14/23 Yes Anders Simmonds T, DO  traMADol (ULTRAM) 50 MG tablet Take 50 mg by mouth 2 (two) times daily. 11/16/18  Yes [provider]      Allergies    Aspirin    Review of Systems   Review of Systems  Neurological:  Positive for weakness.    Physical Exam Updated Vital Signs BP 132/77   Pulse 90   Temp 99.5 F (37.5 C) (Oral)   Resp 17   Ht 5' 5.5" (1.664 m)   Wt 75.3 kg   SpO2 94%   BMI 27.20 kg/m  Physical Exam Vitals reviewed.  HENT:     Head: Normocephalic.  Eyes:     Pupils: Pupils are equal, round, and reactive to light.  Pulmonary:     Effort: Pulmonary effort is normal.  Abdominal:     General: Abdomen is flat.     Palpations: Abdomen is soft.     Tenderness: There is no abdominal tenderness.  Neurological:     General: No focal deficit present.     Mental Status: She is alert.     Gait: Gait normal.     ED Results / Procedures / Treatments   Labs (all  labs ordered are listed, but only abnormal results are displayed) Labs Reviewed  CBC WITH DIFFERENTIAL/PLATELET - Abnormal; Notable for the following components:      Result Value   WBC 11.7 (*)    Neutro Abs 9.5 (*)    All other components within normal limits  COMPREHENSIVE METABOLIC PANEL - Abnormal; Notable for the following components:   Sodium 133 (*)    Glucose, Bld 163 (*)    BUN 29 (*)    Calcium 8.6 (*)    Total Protein 6.3 (*)    All other components within normal limits  PROTIME-INR  URINALYSIS, ROUTINE W REFLEX MICROSCOPIC  TYPE AND SCREEN    EKG None  Radiology CT ABDOMEN PELVIS W CONTRAST  Result Date: 05/15/2023 CLINICAL DATA:  Epigastric pain, generalized weakness, dark colored acid reflux and history of bleeding ulcers EXAM: CT ABDOMEN AND PELVIS WITH CONTRAST TECHNIQUE: Multidetector CT imaging of the abdomen and pelvis was  performed using the standard protocol following bolus administration of intravenous contrast. RADIATION DOSE REDUCTION: This exam was performed according to the departmental dose-optimization program which includes automated exposure control, adjustment of the mA and/or kV according to patient size and/or use of iterative reconstruction technique. CONTRAST:  OMNIPAQUE IOHEXOL 300 MG/ML  SOLN COMPARISON:  None Available. FINDINGS: Lower chest: No acute abnormality. Hepatobiliary: Unremarkable liver. Normal gallbladder. No biliary dilation. Pancreas: Unremarkable. Spleen: Unremarkable. Adrenals/Urinary Tract: Normal adrenal glands. No urinary calculi or hydronephrosis. Bladder is unremarkable. Stomach/Bowel: Postoperative change of gastric bypass. Normal caliber large and small bowel. No bowel wall thickening. The appendix is normal. Vascular/Lymphatic: No significant vascular findings are present. No enlarged abdominal or pelvic lymph nodes. Reproductive: Focus of enhancement in the uterine fundus presumably due to uterine fibroid. No suspicious adnexal mass. Other: No free intraperitoneal fluid or air. Musculoskeletal: No acute fracture. IMPRESSION: 1. No acute abnormality in the abdomen or pelvis. 2. Postoperative change of gastric bypass. Electronically Signed   By: Minerva Fester M.D.   On: 05/15/2023 20:22    Procedures Procedures    Medications Ordered in ED Medications  pantoprazole (PROTONIX) injection 40 mg (40 mg Intravenous Given 05/15/23 1745)  iohexol (OMNIPAQUE) 300 MG/ML solution 100 mL (100 mLs Intravenous Contrast Given 05/15/23 1830)    ED Course/ Medical Decision Making/ A&P                                 Medical Decision Making 72 year old female here today for weakness.  Differential diagnoses include anemia, less likely underlying infection, peptic ulcer disease.  Plan-overall patient looks quite well.  Her initial heart rate is 116.  Will check for underlying infectious  cause, although I think that the patient's heart rate may be elevated just from coming back to the room.  Will give the patient some Protonix.  CT imaging the abdomen pelvis to assess for active bleeding also ordered.  Reassessment-patient's CT imaging negative.  Her BUN is mildly elevated, may be consistent with an upper GI bleed from gastric ulcers.  Will start the patient on regular omeprazole, and Carafate.  Will have her follow-up with GI.  Amount and/or Complexity of Data Reviewed Labs: ordered. Radiology: ordered.  Risk Prescription drug management.           Final Clinical Impression(s) / ED Diagnoses Final diagnoses:  Peptic ulcer disease    Rx / DC Orders ED Discharge Orders  Ordered    omeprazole (PRILOSEC) 20 MG capsule  Daily        05/15/23 2044    sucralfate (CARAFATE) 1 g tablet  3 times daily with meals & bedtime        05/15/23 2044              Anders Simmonds T, DO 05/15/23 2044

## 2023-05-18 ENCOUNTER — Other Ambulatory Visit: Payer: Self-pay

## 2023-05-18 ENCOUNTER — Inpatient Hospital Stay
Admission: EM | Admit: 2023-05-18 | Discharge: 2023-05-21 | DRG: 378 | Disposition: A | Discharge status: Home / Self Care | Payer: Medicare HMO | Attending: Internal Medicine | Admitting: Internal Medicine

## 2023-05-18 DIAGNOSIS — Z833 Family history of diabetes mellitus: Secondary | ICD-10-CM

## 2023-05-18 DIAGNOSIS — E785 Hyperlipidemia, unspecified: Secondary | ICD-10-CM | POA: Diagnosis present

## 2023-05-18 DIAGNOSIS — Z66 Do not resuscitate: Secondary | ICD-10-CM | POA: Diagnosis present

## 2023-05-18 DIAGNOSIS — K920 Hematemesis: Principal | ICD-10-CM

## 2023-05-18 DIAGNOSIS — Z87891 Personal history of nicotine dependence: Secondary | ICD-10-CM

## 2023-05-18 DIAGNOSIS — E039 Hypothyroidism, unspecified: Secondary | ICD-10-CM | POA: Diagnosis present

## 2023-05-18 DIAGNOSIS — K219 Gastro-esophageal reflux disease without esophagitis: Secondary | ICD-10-CM | POA: Diagnosis present

## 2023-05-18 DIAGNOSIS — D62 Acute posthemorrhagic anemia: Secondary | ICD-10-CM | POA: Diagnosis present

## 2023-05-18 DIAGNOSIS — K922 Gastrointestinal hemorrhage, unspecified: Secondary | ICD-10-CM

## 2023-05-18 DIAGNOSIS — R195 Other fecal abnormalities: Secondary | ICD-10-CM

## 2023-05-18 DIAGNOSIS — Z79890 Hormone replacement therapy (postmenopausal): Secondary | ICD-10-CM

## 2023-05-18 DIAGNOSIS — Z980 Intestinal bypass and anastomosis status: Secondary | ICD-10-CM

## 2023-05-18 DIAGNOSIS — R1013 Epigastric pain: Secondary | ICD-10-CM

## 2023-05-18 DIAGNOSIS — Z8249 Family history of ischemic heart disease and other diseases of the circulatory system: Secondary | ICD-10-CM

## 2023-05-18 DIAGNOSIS — K3182 Dieulafoy lesion (hemorrhagic) of stomach and duodenum: Secondary | ICD-10-CM | POA: Diagnosis not present

## 2023-05-18 DIAGNOSIS — Z886 Allergy status to analgesic agent status: Secondary | ICD-10-CM

## 2023-05-18 DIAGNOSIS — Z803 Family history of malignant neoplasm of breast: Secondary | ICD-10-CM

## 2023-05-18 LAB — CBC
HCT: 26.7 % — ABNORMAL LOW (ref 36.0–46.0)
HCT: 21.3 % — ABNORMAL LOW (ref 36.0–46.0)
Hemoglobin: 8.5 g/dL — ABNORMAL LOW (ref 12.0–15.0)
Hemoglobin: 7.1 g/dL — ABNORMAL LOW (ref 12.0–15.0)
MCH: 30.2 pg (ref 26.0–34.0)
MCH: 31.3 pg (ref 26.0–34.0)
MCHC: 31.8 g/dL (ref 30.0–36.0)
MCHC: 33.3 g/dL (ref 30.0–36.0)
MCV: 95 fL (ref 80.0–100.0)
MCV: 93.8 fL (ref 80.0–100.0)
Platelets: 245 10*3/uL (ref 150–400)
Platelets: 200 10*3/uL (ref 150–400)
RBC: 2.81 MIL/uL — ABNORMAL LOW (ref 3.87–5.11)
RBC: 2.27 MIL/uL — ABNORMAL LOW (ref 3.87–5.11)
RDW: 13.1 % (ref 11.5–15.5)
RDW: 13.2 % (ref 11.5–15.5)
WBC: 9.2 10*3/uL (ref 4.0–10.5)
WBC: 8.9 10*3/uL (ref 4.0–10.5)
nRBC: 0 % (ref 0.0–0.2)
nRBC: 0 % (ref 0.0–0.2)

## 2023-05-18 LAB — LIPASE, BLOOD: Lipase: 57 U/L — ABNORMAL HIGH (ref 11–51)

## 2023-05-18 LAB — COMPREHENSIVE METABOLIC PANEL
ALT: 19 U/L (ref 0–44)
AST: 20 U/L (ref 15–41)
Albumin: 3 g/dL — ABNORMAL LOW (ref 3.5–5.0)
Alkaline Phosphatase: 31 U/L — ABNORMAL LOW (ref 38–126)
Anion gap: 8 (ref 5–15)
BUN: 16 mg/dL (ref 8–23)
CO2: 23 mmol/L (ref 22–32)
Calcium: 8.1 mg/dL — ABNORMAL LOW (ref 8.9–10.3)
Chloride: 106 mmol/L (ref 98–111)
Creatinine, Ser: 0.58 mg/dL (ref 0.44–1.00)
GFR, Estimated: >60 mL/min (ref >60)
Glucose, Bld: 132 mg/dL — ABNORMAL HIGH (ref 70–99)
Potassium: 3.7 mmol/L (ref 3.5–5.1)
Sodium: 137 mmol/L (ref 135–145)
Total Bilirubin: 0.4 mg/dL (ref 0.3–1.2)
Total Protein: 5.3 g/dL — ABNORMAL LOW (ref 6.5–8.1)

## 2023-05-18 LAB — FOLATE: Folate: 15.8 ng/mL (ref >5.9)

## 2023-05-18 MED ORDER — HYDRALAZINE HCL 20 MG/ML IJ SOLN: 5.0000 mg | INTRAMUSCULAR | Status: DC | PRN

## 2023-05-18 MED ORDER — PANTOPRAZOLE SODIUM 40 MG IV SOLR: 40.0000 mg | Freq: Two times a day (BID) | INTRAVENOUS | Status: DC

## 2023-05-18 MED ORDER — LOVASTATIN ER 40 MG PO TB24: 40.0000 mg | ORAL_TABLET | Freq: Every day | ORAL | Status: DC

## 2023-05-18 MED ORDER — ACETAMINOPHEN 325 MG PO TABS
650.0000 mg | ORAL_TABLET | Freq: Four times a day (QID) | ORAL | Status: DC | PRN
  Administered 2023-05-19: 650 mg via ORAL
  Filled 2023-05-18: qty 2

## 2023-05-18 MED ORDER — PRAVASTATIN SODIUM 40 MG PO TABS
40.0000 mg | ORAL_TABLET | Freq: Every day | ORAL | Status: DC
  Administered 2023-05-18 – 2023-05-20 (×3): 40 mg via ORAL
  Filled 2023-05-18 (×3): qty 1

## 2023-05-18 MED ORDER — MORPHINE SULFATE (PF) 2 MG/ML IV SOLN: 2.0000 mg | INTRAVENOUS | Status: DC | PRN

## 2023-05-18 MED ORDER — SODIUM CHLORIDE 0.9 % IV BOLUS
1000.0000 mL | Freq: Once | INTRAVENOUS | Status: AC
  Administered 2023-05-18: 1000 mL via INTRAVENOUS

## 2023-05-18 MED ORDER — SODIUM CHLORIDE 0.9% FLUSH
3.0000 mL | Freq: Two times a day (BID) | INTRAVENOUS | Status: DC
  Administered 2023-05-18 – 2023-05-20 (×5): 3 mL via INTRAVENOUS

## 2023-05-18 MED ORDER — ACETAMINOPHEN 650 MG RE SUPP: 650.0000 mg | Freq: Four times a day (QID) | RECTAL | Status: DC | PRN

## 2023-05-18 MED ORDER — SODIUM CHLORIDE 0.9 % IV SOLN
250.0000 mg | Freq: Every day | INTRAVENOUS | Status: DC
  Administered 2023-05-18 – 2023-05-21 (×3): 250 mg via INTRAVENOUS
  Filled 2023-05-18 (×4): qty 20

## 2023-05-18 MED ORDER — BACLOFEN 10 MG PO TABS
10.0000 mg | ORAL_TABLET | Freq: Every day | ORAL | Status: DC
  Administered 2023-05-20 – 2023-05-21 (×2): 10 mg via ORAL
  Filled 2023-05-18 (×3): qty 1

## 2023-05-18 MED ORDER — PANTOPRAZOLE 80MG IVPB - SIMPLE MED
80.0000 mg | Freq: Once | INTRAVENOUS | Status: AC
  Administered 2023-05-18: 80 mg via INTRAVENOUS
  Filled 2023-05-18: qty 100

## 2023-05-18 MED ORDER — LEVOTHYROXINE SODIUM 75 MCG PO TABS
75.0000 ug | ORAL_TABLET | Freq: Every day | ORAL | Status: DC
  Administered 2023-05-19 – 2023-05-21 (×3): 75 ug via ORAL
  Filled 2023-05-18: qty 3
  Filled 2023-05-18: qty 1
  Filled 2023-05-18 (×2): qty 3
  Filled 2023-05-18 (×2): qty 1

## 2023-05-18 MED ORDER — ONDANSETRON HCL 4 MG/2ML IJ SOLN
4.0000 mg | Freq: Four times a day (QID) | INTRAMUSCULAR | Status: DC | PRN
  Administered 2023-05-18 – 2023-05-19 (×3): 4 mg via INTRAVENOUS
  Filled 2023-05-18 (×2): qty 2

## 2023-05-18 MED ORDER — PANTOPRAZOLE INFUSION (NEW) - SIMPLE MED
8.0000 mg/h | INTRAVENOUS | Status: DC
  Administered 2023-05-18 – 2023-05-21 (×7): 8 mg/h via INTRAVENOUS
  Filled 2023-05-18 (×7): qty 100

## 2023-05-18 MED ORDER — ONDANSETRON HCL 4 MG PO TABS: 4.0000 mg | ORAL_TABLET | Freq: Four times a day (QID) | ORAL | Status: DC | PRN

## 2023-05-18 MED ORDER — ONDANSETRON HCL 4 MG/2ML IJ SOLN
4.0000 mg | Freq: Once | INTRAMUSCULAR | Status: AC
  Administered 2023-05-18: 4 mg via INTRAVENOUS
  Filled 2023-05-18: qty 2

## 2023-05-18 MED ORDER — LACTATED RINGERS IV SOLN: INTRAVENOUS | Status: DC

## 2023-05-18 NOTE — ED Provider Notes (Signed)
[facility] Provider Note    Event Date/Time   First MD Initiated Contact with Patient 05/18/23 1246     (approximate)   History   Abdominal Pain   HPI  Diane Weiss is a 72 year old female with history of GI bleed secondary to stomach ulcers, hypothyroidism presenting to the emergency department for evaluation of hematemesis.  Patient has been having epigastric pain for a few days.  On Tuesday, she was seen at outside ER with a small amount of dark vomit.  Her CT scan at that time was reassuring with a stable hemoglobin of 12.3.  However, today, patient had episode of bright red emesis leading to ER presentation and while in the ER has had another episode of vomiting that she reports was mixed with black and red color.  Has also noticed some black stools.  Does report pain primarily in her epigastric area.  Is concerned about recurrent ulcer bleed.  No history of alcohol use, known liver disease, known variceal history.    Physical Exam   Triage Vital Signs: ED Triage Vitals  Encounter Vitals Group     BP 05/18/23 1238 129/66     Systolic BP Percentile --      Diastolic BP Percentile --      Pulse Rate 05/18/23 1238 (!) 110     Resp 05/18/23 1238 18     Temp 05/18/23 1238 98.4 F (36.9 C)     Temp Source 05/18/23 1238 Oral     SpO2 05/18/23 1238 100 %     Weight 05/18/23 1239 165 lb (74.8 kg)     Height 05/18/23 1239 5\' 5"  (1.651 m)     Head Circumference --      Peak Flow --      Pain Score 05/18/23 1239 4     Pain Loc --      Pain Education --      Exclude from Growth Chart --     Most recent vital signs: Vitals:   05/18/23 1238 05/18/23 1300  BP: 129/66 (!) 142/66  Pulse: (!) 110 86  Resp: 18 (!) 24  Temp: 98.4 F (36.9 C)   SpO2: 100% 100%     General: Awake, interactive  CV:  Mild tachycardia with regular rhythm, normal peripheral perfusion Resp:  Unlabored respirations.  Abd:  Soft, nondistended, mild tender in the epigastric  region, remainder of abdomen nontender, dark black stool on rectal exam, readily Hemoccult positive Neuro:  Symmetric facial movement, fluid speech   ED Results / Procedures / Treatments   Labs (all labs ordered are listed, but only abnormal results are displayed) Labs Reviewed  LIPASE, BLOOD - Abnormal; Notable for the following components:      Result Value   Lipase 57 (*)    All other components within normal limits  COMPREHENSIVE METABOLIC PANEL - Abnormal; Notable for the following components:   Glucose, Bld 132 (*)    Calcium 8.1 (*)    Total Protein 5.3 (*)    Albumin 3.0 (*)    Alkaline Phosphatase 31 (*)    All other components within normal limits  CBC - Abnormal; Notable for the following components:   RBC 2.81 (*)    Hemoglobin 8.5 (*)    HCT 26.7 (*)    All other components within normal limits  CBC  CBC  TYPE AND SCREEN     EKG EKG independently reviewed interpreted by myself (ER attending) demonstrates:  RADIOLOGY Imaging independently reviewed and interpreted by myself demonstrates:    PROCEDURES:  Critical Care performed: Yes, see critical care procedure note(s)  CRITICAL CARE Performed by: Trinna Post   Total critical care time: 32 minutes  Critical care time was exclusive of separately billable procedures and treating other patients.  Critical care was necessary to treat or prevent imminent or life-threatening deterioration.  Critical care was time spent personally by me on the following activities: development of treatment plan with patient and/or surrogate as well as nursing, discussions with consultants, evaluation of patient's response to treatment, examination of patient, obtaining history from patient or surrogate, ordering and performing treatments and interventions, ordering and review of laboratory studies, ordering and review of radiographic studies, pulse oximetry and re-evaluation of patient's  condition.   Procedures   MEDICATIONS ORDERED IN ED: Medications  pantoprozole (PROTONIX) 80 mg /NS 100 mL infusion (8 mg/hr Intravenous New Bag/Given 05/18/23 1359)  pantoprazole (PROTONIX) injection 40 mg (has no administration in time range)  lactated ringers infusion (has no administration in time range)  acetaminophen (TYLENOL) tablet 650 mg (has no administration in time range)    Or  acetaminophen (TYLENOL) suppository 650 mg (has no administration in time range)  morphine (PF) 2 MG/ML injection 2 mg (has no administration in time range)  ondansetron (ZOFRAN) tablet 4 mg (has no administration in time range)    Or  ondansetron (ZOFRAN) injection 4 mg (has no administration in time range)  hydrALAZINE (APRESOLINE) injection 5 mg (has no administration in time range)  sodium chloride flush (NS) 0.9 % injection 3 mL (has no administration in time range)  pantoprazole (PROTONIX) 80 mg /NS 100 mL IVPB (0 mg Intravenous Stopped 05/18/23 1357)  sodium chloride 0.9 % bolus 1,000 mL (1,000 mLs Intravenous New Bag/Given 05/18/23 1333)  ondansetron (ZOFRAN) injection 4 mg (4 mg Intravenous Given 05/18/23 1332)     IMPRESSION / MDM / ASSESSMENT AND PLAN / ED COURSE  I reviewed the triage vital signs and the nursing notes.  Differential diagnosis includes, but is not limited to, GI bleed secondary to stomach ulcers, other upper GI bleed source,  Patient's presentation is most consistent with acute presentation with potential threat to life or bodily function.  72 year old female presenting to the emergency department for evaluation of hematemesis.  Known history of gastric ulcers.  Does have an acute hemoglobin drop from 12.3 to 8.5 over the past 3 days.  Mildly tachycardic on presentation, but fortunately improved to heart rate in the 80s at the time of my initial evaluation with stable blood pressure.  Protonix drip ordered as well as Zofran and IV fluids.  Will reach out to hospitalist team to  discuss admission.  Case reviewed with Dr. Ophelia Charter with hospitalist team.  She will evaluate the patient for anticipated admission.  She did request that I contact GI team.  Dr. Allegra Lai notified.  Continue plan for admission.       FINAL CLINICAL IMPRESSION(S) / ED DIAGNOSES   Final diagnoses:  Hematemesis with nausea  Dark stools  Epigastric pain     Rx / DC Orders   ED Discharge Orders     None        Note:  This document was prepared using Dragon voice recognition software and may include unintentional dictation errors.   Trinna Post, MD 05/18/23 [phone]

## 2023-05-18 NOTE — ED Notes (Signed)
This RN assisted pt to toilet and back to bed.  ?

## 2023-05-18 NOTE — ED Triage Notes (Signed)
Pt presents to the ED from home via [facility]. Pt states that she has been throwing up blood since this morning. Pt states that she was seen at [facility] on Tuesday and states that she had a CT scan done there which showed possibly one small bleeding ulcer. States that she was discharged from the ED with omeprazole and Carafate. Pt states that she was feeling better and started throwing up the blood again this morning with returned abdominal pain. Pt A&Ox4 at time of triage. Pt states that she only vomited once today. Pt reports black stool yesterday. Pt does report feeling lightheaded

## 2023-05-18 NOTE — ED Notes (Signed)
This RN to bedside to introduce self to pt. Pt is caox4 and in no acute distress.

## 2023-05-18 NOTE — ED Notes (Signed)
Pt up to toilet to void. Pt changed in hospital gown.  Now resting with no other needs at this time.

## 2023-05-18 NOTE — ED Notes (Signed)
First nurse note: Black stools started yesterday, emesis with blood, pt has HX of ulcers.   165/69 99%  HR: 93 98.5 CBG  142

## 2023-05-18 NOTE — H&P (Signed)
History and Physical    Patient: Diane Weiss XBM:[id number] DOB: [birth year] DOA: 05/18/2023 DOS: the patient was seen and examined on 05/18/2023 PCP: Jerl Mina, MD  Patient coming from: Home - lives alone; NOK: Vincent Peyer [place], [phone]   Chief Complaint: UGI bleeding  HPI: Diane Weiss is a 72 y.o. female with medical history significant of GI bleeding from stomach ulcers in 2022, HLD, and hypothyroidism presenting with hematemesis.  She was seen at [facility] on 9/3 for weakness x 2 weeks and was concerned about possible ulcer; omeprazole was resumed and she was given Carafate and told to f/u with GI.  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  Her last episode was in 2022 and EGD at that time showed a patent Billroth II gastrojejunectomy with ulceration at the anastomosis.  She is due for her screening colonoscopy soon.  She takes omeprazole periodically for GERD only.  She has noticed recent UGI discomfort and started seeing some reddish emesis recently and so went to [facility] on 9/3.  She was started back on omeprazole and Carafate.  She continued to feel somewhat lightheaded over the last day or two with persistent discomfort.  This AM, she had large-volume hematemesis x 2 and so came to the ER (once at home and once here).  She currently is feeling some better.    ER Course:  h/o UGI bleeding in 2022.  Worsening epigastric pain, seen at [facility] on 9/3, Hgb 12.  Dark vomit but not obviously bloody.  Pain and hematemesis today, Hgb 8.5.  Type and screen pending.  BP 142/66.  Given Protonix.  Will notify GI.     Review of Systems: As mentioned in the history of present illness. All other systems reviewed and are negative. Past Medical History:  Diagnosis Date   Acute blood loss anemia    GI bleeding 01/04/2021   History of stomach ulcers    Hyperlipidemia    Symptomatic anemia    Thyroid disease    Vertigo    no  episodes for several years   Wears dentures    partial lower   Past Surgical History:  Procedure Laterality Date   CARPAL TUNNEL RELEASE Right 07/07/2016   Procedure: OPEN CARPAL TUNNEL RELEASE;  Surgeon: Erin Sons, MD;  Location: [facility];  Service: Orthopedics;  Laterality: Right;   ESOPHAGOGASTRODUODENOSCOPY (EGD) WITH PROPOFOL N/A 01/05/2021   Procedure: ESOPHAGOGASTRODUODENOSCOPY (EGD) WITH PROPOFOL;  Surgeon: Toney Reil, MD;  Location: [facility];  Service: Gastroenterology;  Laterality: N/A;   KNEE SURGERY  1990   STOMACH SURGERY  1983   bleeding ulcers   Social History:  reports that she quit smoking about 41 years ago. She has never used smokeless tobacco. She reports that she does not drink alcohol and does not use drugs.  Allergies  Allergen Reactions   Aspirin Other (See Comments)    Had stomach surgery in the past, may cause bleeding    Family History  Problem Relation Age of Onset   Hypertension Mother    Diabetes Mother    Hypertension Father    Cancer Cousin    Breast cancer Sister 106   Breast cancer Maternal Aunt 47   Breast cancer Paternal Aunt 33    Prior to Admission medications   Medication Sig Start Date End Date Taking? Authorizing Provider  acetaminophen (TYLENOL) 325 MG tablet Take 650 mg by mouth daily as needed  for mild pain or headache.    [provider]  baclofen (LIORESAL) 10 MG tablet Take 10 mg by mouth daily.    [provider]  Cholecalciferol (D3) 50 MCG (2000 UT) TABS Take 2,000 Units by mouth at bedtime.    [provider]  levothyroxine (SYNTHROID, LEVOTHROID) 75 MCG tablet Take 75 mcg by mouth daily before breakfast.    [provider]  lovastatin (ALTOPREV) 40 MG 24 hr tablet Take 40 mg by mouth at bedtime.    [provider]  Lutein 20 MG TABS Take 20 mg by mouth daily.    [provider]  Multiple Vitamin (MULTI-VITAMIN) tablet Take 1 tablet by mouth  daily.    [provider]  Na Sulfate-K Sulfate-Mg Sulf 17.5-3.13-1.6 GM/177ML SOLN Take 1 Bottle by mouth as directed. 04/12/23   [provider]  omeprazole (PRILOSEC) 20 MG capsule Take 2 capsules (40 mg total) by mouth daily. 05/15/23 06/14/23  Anders Simmonds T, DO  polyethylene glycol (MIRALAX) 17 g packet Take 17 g by mouth daily as needed for moderate constipation. 01/05/21   Albertine Grates, MD  sucralfate (CARAFATE) 1 g tablet Take 1 tablet (1 g total) by mouth 4 (four) times daily -  with meals and at bedtime. 05/15/23 06/14/23  Arletha Pili, DO  traMADol (ULTRAM) 50 MG tablet Take 50 mg by mouth 2 (two) times daily. 11/16/18   [provider]    Physical Exam: Vitals:   05/18/23 1238 05/18/23 1239 05/18/23 1300  BP: 129/66  (!) 142/66  Pulse: (!) 110  86  Resp: 18  (!) 24  Temp: 98.4 F (36.9 C)    TempSrc: Oral    SpO2: 100%  100%  Weight:  74.8 kg   Height:  5\' 5"  (1.651 m)    General:  Appears calm and comfortable and is in NAD Eyes:  PERRL, EOMI, normal lids, iris ENT:  grossly normal hearing, lips & tongue, mmm; appropriate dentition Neck:  no LAD, masses or thyromegaly Cardiovascular:  RRR, no m/r/g. No LE edema.  Respiratory:   CTA bilaterally with no wheezes/rales/rhonchi.  Normal respiratory effort. Abdomen:  soft, mild epigastric TTP, ND, NABS Skin:  no rash or induration seen on limited exam Musculoskeletal:  grossly normal tone BUE/BLE, good ROM, no bony abnormality Psychiatric:  grossly normal mood and affect, speech fluent and appropriate, AOx3 Neurologic:  CN 2-12 grossly intact, moves all extremities in coordinated fashion   Radiological Exams on Admission: Independently reviewed - see discussion in A/P where applicable  No results found.  EKG: Independently reviewed.  Sinus tachycardia with rate 110; nonspecific ST changes with no evidence of acute ischemia   Labs on Admission: I have personally reviewed the available labs and  imaging studies at the time of the admission.  Pertinent labs:    Glucose 132 Calcium 8.1 Albumin 3.0 WBC 9.2 Hgb 8.5, down from 12.3 on 9/3   Assessment and Plan: Principal Problem:   Acute upper GI bleeding Active Problems:   Hyperlipidemia   ABLA (acute blood loss anemia)   Hypothyroidism   DNR (do not resuscitate)    UGI Bleeding -Patient is presenting with melena and hematemesis, suggestive of upper GI bleeding. 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  -Will  observe on progressive bed -GI consulted by ED, will follow up recommendations -NPO for possible EGD -LR at 100 mL/hr -Start IV pantoprazole bolus and infusion given frank bleeding with concern for hemodynamic instability -Zofran IV for nausea -Avoid NSAIDs and SQ heparin -Maintain IV access (2 large bore IVs if possible).  ABLA -Patient's lightheadedness is most likely caused by anemia secondary to upper GI bleeding.  -Her Hgb decreased from 12.3 on 9/4 to 8.5 today.  -Type and screen were done in ED.  -Monitor closely and follow cbc q6h, transfuse as necessary for Hbg <7   HLD -Continue Lovastatin  Hypothyroidism -Continue Synthroid  DNR -I have discussed code status with the patient and she would not desire resuscitation and would prefer to die a natural death should that situation arise.    Advance Care Planning:   Code Status: Do not attempt resuscitation (DNR) PRE-ARREST INTERVENTIONS DESIRED   Consults: GI  DVT Prophylaxis: SCDs  Family Communication: None present; she reports that her son "just left" and she did not request that I contact him  Severity of Illness: The appropriate patient status for this patient is OBSERVATION.  Observation status is judged to be reasonable and necessary in order to provide the required intensity of service to ensure the patient's safety. The patient's presenting symptoms, physical exam findings, and initial radiographic and laboratory data in the context of their medical condition is felt to place them at decreased risk for further clinical deterioration. Furthermore, it is anticipated that the patient will be medically stable for discharge from the hospital within 2 midnights of admission.   Author: Jonah Blue, MD 05/18/2023 3:14 PM  For on call review [email or web address].

## 2023-05-18 NOTE — ED Notes (Signed)
This RN called 2A to ask about purple man handoff as it is not showing on our end. This RN spoke with assigned RN Harrold Donath and he advised handoff has been completed and it was ok to send her up.

## 2023-05-19 ENCOUNTER — Observation Stay: Payer: Medicare HMO | Admitting: Anesthesiology

## 2023-05-19 ENCOUNTER — Encounter
Admission: EM | Disposition: A | Discharge status: Home / Self Care | Payer: Self-pay | Source: Home / Self Care | Attending: Internal Medicine

## 2023-05-19 DIAGNOSIS — E785 Hyperlipidemia, unspecified: Secondary | ICD-10-CM | POA: Diagnosis present

## 2023-05-19 DIAGNOSIS — E039 Hypothyroidism, unspecified: Secondary | ICD-10-CM | POA: Diagnosis present

## 2023-05-19 DIAGNOSIS — Z66 Do not resuscitate: Secondary | ICD-10-CM | POA: Diagnosis present

## 2023-05-19 DIAGNOSIS — K3182 Dieulafoy lesion (hemorrhagic) of stomach and duodenum: Principal | ICD-10-CM

## 2023-05-19 DIAGNOSIS — D62 Acute posthemorrhagic anemia: Secondary | ICD-10-CM | POA: Diagnosis present

## 2023-05-19 DIAGNOSIS — K219 Gastro-esophageal reflux disease without esophagitis: Secondary | ICD-10-CM | POA: Diagnosis present

## 2023-05-19 DIAGNOSIS — Z803 Family history of malignant neoplasm of breast: Secondary | ICD-10-CM | POA: Diagnosis not present

## 2023-05-19 DIAGNOSIS — Z886 Allergy status to analgesic agent status: Secondary | ICD-10-CM | POA: Diagnosis not present

## 2023-05-19 DIAGNOSIS — K922 Gastrointestinal hemorrhage, unspecified: Secondary | ICD-10-CM | POA: Diagnosis present

## 2023-05-19 DIAGNOSIS — Z79890 Hormone replacement therapy (postmenopausal): Secondary | ICD-10-CM | POA: Diagnosis not present

## 2023-05-19 DIAGNOSIS — R1013 Epigastric pain: Secondary | ICD-10-CM | POA: Diagnosis present

## 2023-05-19 DIAGNOSIS — K920 Hematemesis: Secondary | ICD-10-CM | POA: Diagnosis not present

## 2023-05-19 DIAGNOSIS — Z87891 Personal history of nicotine dependence: Secondary | ICD-10-CM | POA: Diagnosis not present

## 2023-05-19 DIAGNOSIS — Z980 Intestinal bypass and anastomosis status: Secondary | ICD-10-CM | POA: Diagnosis not present

## 2023-05-19 DIAGNOSIS — R195 Other fecal abnormalities: Secondary | ICD-10-CM

## 2023-05-19 DIAGNOSIS — Z833 Family history of diabetes mellitus: Secondary | ICD-10-CM | POA: Diagnosis not present

## 2023-05-19 DIAGNOSIS — Z8249 Family history of ischemic heart disease and other diseases of the circulatory system: Secondary | ICD-10-CM | POA: Diagnosis not present

## 2023-05-19 HISTORY — PX: HEMOSTASIS CLIP PLACEMENT: SHX6857

## 2023-05-19 HISTORY — PX: ESOPHAGOGASTRODUODENOSCOPY (EGD) WITH PROPOFOL: SHX5813

## 2023-05-19 HISTORY — PX: SUBMUCOSAL INJECTION: SHX5543

## 2023-05-19 LAB — FERRITIN: Ferritin: 28 ng/mL (ref 11–307)

## 2023-05-19 LAB — IRON AND TIBC
Iron: 317 ug/dL — ABNORMAL HIGH (ref 28–170)
Saturation Ratios: 90 % — ABNORMAL HIGH (ref 10.4–31.8)
TIBC: 354 ug/dL (ref 250–450)
UIBC: 37 ug/dL

## 2023-05-19 LAB — CBC
HCT: 20.2 % — ABNORMAL LOW (ref 36.0–46.0)
HCT: 24 % — ABNORMAL LOW (ref 36.0–46.0)
Hemoglobin: 6.5 g/dL — ABNORMAL LOW (ref 12.0–15.0)
Hemoglobin: 7.7 g/dL — ABNORMAL LOW (ref 12.0–15.0)
MCH: 30.8 pg (ref 26.0–34.0)
MCH: 29.8 pg (ref 26.0–34.0)
MCHC: 32.2 g/dL (ref 30.0–36.0)
MCHC: 32.1 g/dL (ref 30.0–36.0)
MCV: 95.7 fL (ref 80.0–100.0)
MCV: 93 fL (ref 80.0–100.0)
Platelets: 210 10*3/uL (ref 150–400)
Platelets: 208 10*3/uL (ref 150–400)
RBC: 2.11 MIL/uL — ABNORMAL LOW (ref 3.87–5.11)
RBC: 2.58 MIL/uL — ABNORMAL LOW (ref 3.87–5.11)
RDW: 13.4 % (ref 11.5–15.5)
RDW: 15.1 % (ref 11.5–15.5)
WBC: 11.7 10*3/uL — ABNORMAL HIGH (ref 4.0–10.5)
WBC: 13.3 10*3/uL — ABNORMAL HIGH (ref 4.0–10.5)
nRBC: 0 % (ref 0.0–0.2)
nRBC: 0.2 % (ref 0.0–0.2)

## 2023-05-19 LAB — BASIC METABOLIC PANEL
Anion gap: 5 (ref 5–15)
BUN: 23 mg/dL (ref 8–23)
CO2: 24 mmol/L (ref 22–32)
Calcium: 7.6 mg/dL — ABNORMAL LOW (ref 8.9–10.3)
Chloride: 110 mmol/L (ref 98–111)
Creatinine, Ser: 0.5 mg/dL (ref 0.44–1.00)
GFR, Estimated: >60 mL/min (ref >60)
Glucose, Bld: 117 mg/dL — ABNORMAL HIGH (ref 70–99)
Potassium: 3.9 mmol/L (ref 3.5–5.1)
Sodium: 139 mmol/L (ref 135–145)

## 2023-05-19 LAB — PREPARE RBC (CROSSMATCH)

## 2023-05-19 LAB — VITAMIN B12: Vitamin B-12: 182 pg/mL (ref 180–914)

## 2023-05-19 LAB — HEMOGLOBIN AND HEMATOCRIT, BLOOD
HCT: 22.1 % — ABNORMAL LOW (ref 36.0–46.0)
Hemoglobin: 7.4 g/dL — ABNORMAL LOW (ref 12.0–15.0)

## 2023-05-19 SURGERY — ESOPHAGOGASTRODUODENOSCOPY (EGD) WITH PROPOFOL
Anesthesia: General

## 2023-05-19 MED ORDER — SODIUM CHLORIDE 0.9 % IV SOLN: INTRAVENOUS | Status: DC

## 2023-05-19 MED ORDER — SUCCINYLCHOLINE CHLORIDE 200 MG/10ML IV SOSY
PREFILLED_SYRINGE | INTRAVENOUS | Status: AC
  Filled 2023-05-19: qty 20

## 2023-05-19 MED ORDER — PROPOFOL 10 MG/ML IV BOLUS
INTRAVENOUS | Status: DC | PRN
  Administered 2023-05-19: 100 mg via INTRAVENOUS

## 2023-05-19 MED ORDER — EPINEPHRINE 1 MG/10ML IJ SOSY
PREFILLED_SYRINGE | INTRAMUSCULAR | Status: DC | PRN
Start: 2023-05-19 — End: 2023-05-19
  Administered 2023-05-19: .6 mg via INTRAVENOUS

## 2023-05-19 MED ORDER — FENTANYL CITRATE (PF) 100 MCG/2ML IJ SOLN
INTRAMUSCULAR | Status: DC | PRN
  Administered 2023-05-19 (×2): 25 ug via INTRAVENOUS
  Administered 2023-05-19: 50 ug via INTRAVENOUS

## 2023-05-19 MED ORDER — PROPOFOL 500 MG/50ML IV EMUL
INTRAVENOUS | Status: DC | PRN
Start: 2023-05-19 — End: 2023-05-19
  Administered 2023-05-19: 150 ug/kg/min via INTRAVENOUS

## 2023-05-19 MED ORDER — PROPOFOL 10 MG/ML IV BOLUS
INTRAVENOUS | Status: AC
  Filled 2023-05-19: qty 20

## 2023-05-19 MED ORDER — ONDANSETRON HCL 4 MG/2ML IJ SOLN
INTRAMUSCULAR | Status: AC
  Filled 2023-05-19: qty 2

## 2023-05-19 MED ORDER — SUCCINYLCHOLINE CHLORIDE 200 MG/10ML IV SOSY
PREFILLED_SYRINGE | INTRAVENOUS | Status: DC | PRN
  Administered 2023-05-19: 120 mg via INTRAVENOUS

## 2023-05-19 MED ORDER — DEXAMETHASONE SODIUM PHOSPHATE 10 MG/ML IJ SOLN
INTRAMUSCULAR | Status: DC | PRN
  Administered 2023-05-19: 10 mg via INTRAVENOUS

## 2023-05-19 MED ORDER — FENTANYL CITRATE (PF) 100 MCG/2ML IJ SOLN
INTRAMUSCULAR | Status: AC
  Filled 2023-05-19: qty 2

## 2023-05-19 MED ORDER — PHENYLEPHRINE 80 MCG/ML (10ML) SYRINGE FOR IV PUSH (FOR BLOOD PRESSURE SUPPORT)
PREFILLED_SYRINGE | INTRAVENOUS | Status: AC
  Filled 2023-05-19: qty 10

## 2023-05-19 MED ORDER — SODIUM CHLORIDE 0.9 % IV SOLN: Freq: Once | INTRAVENOUS | Status: AC

## 2023-05-19 MED ORDER — EPINEPHRINE 1 MG/10ML IJ SOSY
PREFILLED_SYRINGE | INTRAMUSCULAR | Status: AC
  Filled 2023-05-19: qty 10

## 2023-05-19 MED ORDER — LIDOCAINE HCL (PF) 2 % IJ SOLN
INTRAMUSCULAR | Status: AC
  Filled 2023-05-19: qty 5

## 2023-05-19 MED ORDER — LIDOCAINE HCL (CARDIAC) PF 100 MG/5ML IV SOSY
PREFILLED_SYRINGE | INTRAVENOUS | Status: DC | PRN
  Administered 2023-05-19: 80 mg via INTRAVENOUS

## 2023-05-19 MED ORDER — ONDANSETRON HCL 4 MG/2ML IJ SOLN
INTRAMUSCULAR | Status: DC | PRN
  Administered 2023-05-19: 4 mg via INTRAVENOUS

## 2023-05-19 MED ORDER — DEXAMETHASONE SODIUM PHOSPHATE 10 MG/ML IJ SOLN
INTRAMUSCULAR | Status: AC
  Filled 2023-05-19: qty 1

## 2023-05-19 MED ORDER — SODIUM CHLORIDE 0.9% IV SOLUTION: Freq: Once | INTRAVENOUS | Status: AC

## 2023-05-19 NOTE — Anesthesia Preprocedure Evaluation (Addendum)
Anesthesia Evaluation  Patient identified by MRN, date of birth, ID band Patient awake    Reviewed: Allergy & Precautions, NPO status , Patient's Chart, lab work & pertinent test results  History of Anesthesia Complications Negative for: history of anesthetic complications  Airway Mallampati: II  TM Distance: >3 FB Neck ROM: Full    Dental  (+) Partial Lower   Pulmonary neg sleep apnea, neg COPD, Patient abstained from smoking.Not current smoker, former smoker   Pulmonary exam normal breath sounds clear to auscultation       Cardiovascular Exercise Tolerance: Good METS(-) hypertension(-) CAD and (-) Past MI negative cardio ROS (-) dysrhythmias  Rhythm:Regular Rate:Normal - Systolic murmurs    Neuro/Psych negative neurological ROS  negative psych ROS   GI/Hepatic PUD,neg GERD  ,,(+)     (-) substance abuse  [Patient with hematemesis, last time was yesterday, but continued spitting up of blood overnight.   Endo/Other  neg diabetesHypothyroidism    Renal/GU negative Renal ROS     Musculoskeletal   Abdominal   Peds  Hematology   Anesthesia Other Findings Past Medical History: No date: Acute blood loss anemia 01/04/2021: GI bleeding No date: History of stomach ulcers No date: Hyperlipidemia No date: Symptomatic anemia No date: Thyroid disease No date: Vertigo     Comment:  no episodes for several years No date: Wears dentures     Comment:  partial lower  Reproductive/Obstetrics                             Anesthesia Physical Anesthesia Plan  ASA: 2  Anesthesia Plan: General   Post-op Pain Management: Minimal or no pain anticipated   Induction: Intravenous and Rapid sequence  PONV Risk Score and Plan: 3 and Ondansetron and Dexamethasone  Airway Management Planned: Oral ETT and Video Laryngoscope Planned  Additional Equipment: None  Intra-op Plan:   Post-operative Plan:  Extubation in OR  Informed Consent: I have reviewed the patients History and Physical, chart, labs and discussed the procedure including the risks, benefits and alternatives for the proposed anesthesia with the patient or authorized representative who has indicated his/her understanding and acceptance.   Patient has DNR.  Discussed DNR with patient and Suspend DNR.   Dental advisory given  Plan Discussed with: CRNA and Surgeon  Anesthesia Plan Comments: (Discussed risks of anesthesia with patient, including PONV, sore throat, lip/dental/eye damage, aspiration. Rare risks discussed as well, such as cardiorespiratory and neurological sequelae, and allergic reactions. Discussed the role of CRNA in patient's perioperative care. Patient understands. Plan for RSI given continued hematemesis.  Discussed DNR. Patient wishes for [place] to perform all measures to keep her alive get her out of the OR, including chest compressions and defibrillation.)       Anesthesia Quick Evaluation

## 2023-05-19 NOTE — Anesthesia Postprocedure Evaluation (Signed)
Anesthesia Post Note  Patient: Dung Dewhirst [place]  Procedure(s) Performed: ESOPHAGOGASTRODUODENOSCOPY (EGD) WITH PROPOFOL SUBMUCOSAL INJECTION HEMOSTASIS CLIP PLACEMENT ESOPHAGOGASTRODUODENOSCOPY (EGD) WITH PROPOFOL  Patient location during evaluation: PACU Anesthesia Type: General Level of consciousness: awake and alert Pain management: pain level controlled Vital Signs Assessment: post-procedure vital signs reviewed and stable Respiratory status: spontaneous breathing, nonlabored ventilation, respiratory function stable and patient connected to nasal cannula oxygen Cardiovascular status: blood pressure returned to baseline and stable Postop Assessment: no apparent nausea or vomiting Anesthetic complications: no   No notable events documented.   Last Vitals:  Vitals:   05/19/23 1430 05/19/23 1628  BP: (!) 140/59 (!) 153/76  Pulse: 83 90  Resp: 17 18  Temp: 36.6 C 37.1 C  SpO2: 94% 94%    Last Pain:  Vitals:   05/19/23 1556  TempSrc:   PainSc: 4                  Corinda Gubler

## 2023-05-19 NOTE — Plan of Care (Signed)

## 2023-05-19 NOTE — Plan of Care (Signed)
  Problem: Education: Goal: Knowledge of General Education information will improve Description: Including pain rating scale, medication(s)/side effects and non-pharmacologic comfort measures Outcome: Progressing   Problem: Elimination: Goal: Will not experience complications related to bowel motility Outcome: Progressing Goal: Will not experience complications related to urinary retention Outcome: Progressing   Problem: Skin Integrity: Goal: Risk for impaired skin integrity will decrease Outcome: Progressing   Problem: Safety: Goal: Ability to remain free from injury will improve Outcome: Progressing   Problem: Clinical Measurements: Goal: Ability to maintain clinical measurements within normal limits will improve Outcome: Progressing Goal: Will remain free from infection Outcome: Progressing Goal: Diagnostic test results will improve Outcome: Progressing Goal: Respiratory complications will improve Outcome: Progressing Goal: Cardiovascular complication will be avoided Outcome: Progressing   Problem: Activity: Goal: Risk for activity intolerance will decrease Outcome: Progressing

## 2023-05-19 NOTE — Op Note (Signed)
[facility] Patient Name: Diane Weiss Procedure Date: 05/19/2023 1:24 PM MRN: [id number] Account #: [id number] Date of Birth: [birth year] Admit Type: Outpatient Age: 72 Room: [facility] ENDO ROOM 4 Gender: Female Note Status: Finalized Instrument Name: Upper Endoscope [id number] Procedure:             Upper GI endoscopy Indications:           Acute post hemorrhagic anemia, Hematemesis, Melena Providers:             Toney Reil MD, MD Referring MD:          Lilla Shook (Referring MD) Medicines:             General Anesthesia Complications:         No immediate complications. Estimated blood loss:                         Minimal. Procedure:             Pre-Anesthesia Assessment:                        - Prior to the procedure, a History and Physical was                         performed, and patient medications and allergies were                         reviewed. The patient is competent. The risks and                         benefits of the procedure and the sedation options and                         risks were discussed with the patient. All questions                         were answered and informed consent was obtained.                         Patient identification and proposed procedure were                         verified by the physician, the nurse, the                         anesthesiologist, the anesthetist and the technician                         in the pre-procedure area in the procedure room in the                         endoscopy suite. Mental Status Examination: alert and                         oriented. Airway Examination: normal oropharyngeal                         airway and neck mobility. Respiratory Examination:  clear to auscultation. CV Examination: normal.                         Prophylactic Antibiotics: The patient does not require                         prophylactic antibiotics. Prior  Anticoagulants: The                         patient has taken no anticoagulant or antiplatelet                         agents. ASA Grade Assessment: III - A patient with                         severe systemic disease. After reviewing the risks and                         benefits, the patient was deemed in satisfactory                         condition to undergo the procedure. The anesthesia                         plan was to use general anesthesia. Immediately prior                         to administration of medications, the patient was                         re-assessed for adequacy to receive sedatives. The                         heart rate, respiratory rate, oxygen saturations,                         blood pressure, adequacy of pulmonary ventilation, and                         response to care were monitored throughout the                         procedure. The physical status of the patient was                         re-assessed after the procedure.                        After obtaining informed consent, the endoscope was                         passed under direct vision. Throughout the procedure,                         the patient's blood pressure, pulse, and oxygen                         saturations were monitored continuously. The Endoscope  was introduced through the mouth, and advanced to the                         afferent jejunal loop. The upper GI endoscopy was                         accomplished without difficulty. The patient tolerated                         the procedure well. Findings:      Evidence of a patent Billroth II gastrojejunostomy was found. The       gastrojejunal anastomosis was characterized by a hemorrhagic appearance.       This was traversed. The efferent limb was examined. The afferent limb       was examined.      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 Clip manufacturer:       [employer]. There was no bleeding at the end of the procedure.       Area was successfully injected with 6 mL of a 0.1 mg/mL solution of       epinephrine for hemostasis.      Esophagogastric landmarks were identified: the gastroesophageal junction       was found at 35 cm from the incisors.      The gastroesophageal junction and examined esophagus were normal. Impression:            - Patent Billroth II gastrojejunostomy was found,                         characterized by a hemorrhagic appearance.                        - Esophagogastric landmarks identified.                        - Normal gastroesophageal junction and esophagus.                        - No specimens collected. Recommendation:        - Return patient to hospital ward for ongoing care.                        - Clear liquid diet today.                        - Continue present medications.                        - Continue PPI drip                        - Monitor CBC closely and transfuse to maintain Hb                         above 7 Procedure Code(s):     --- Professional ---                        43255, Esophagogastroduodenoscopy, flexible,  transoral; with control of bleeding, any method Diagnosis Code(s):     --- Professional ---                        Z98.0, Intestinal bypass and anastomosis status                        D62, Acute posthemorrhagic anemia                        K92.0, Hematemesis                        K92.1, Melena (includes Hematochezia) CPT copyright 2022 American Medical Association. All rights reserved. The codes documented in this report are preliminary and upon coder review may  be revised to meet current compliance requirements. Dr. Libby Maw Toney Reil MD, MD 05/19/2023 2:02:36 PM This report has been signed electronically. Number of Addenda: 0 Note Initiated On: 05/19/2023 1:24 PM       [facility]

## 2023-05-19 NOTE — Progress Notes (Signed)
PROGRESS NOTE    Diane Weiss  YNW:[id number] DOB: [birth year] DOA: 05/18/2023 PCP: Jerl Mina, MD    Brief Narrative:  72 y.o. female with medical history significant of GI bleeding from stomach ulcers in 2022, HLD, and hypothyroidism presenting with hematemesis.  She was seen at [facility] on 9/3 for weakness x 2 weeks and was concerned about possible ulcer; omeprazole was resumed and she was given Carafate and told to f/u with GI.  She reports that she has had gastric ulcers since her 71s and is s/p partial gastrectomy and banding remotely.  Her last episode was in 2022 and EGD at that time showed a patent Billroth II gastrojejunectomy with ulceration at the anastomosis.  She is due for her screening colonoscopy soon.  She takes omeprazole periodically for GERD only.  She has noticed recent UGI discomfort and started seeing some reddish emesis recently and so went to [facility] on 9/3.  She was started back on omeprazole and Carafate.  She continued to feel somewhat lightheaded over the last day or two with persistent discomfort.  This AM, she had large-volume hematemesis x 2 and so came to the ER (once at home and once here).  She currently is feeling some better.   9/7: Hemoglobin down to 6.5.  Transfusing 1 unit PRBC.  Case discussed with GI.  EGD today.  Assessment & Plan:   Principal Problem:   Acute upper GI bleeding Active Problems:   Hyperlipidemia   ABLA (acute blood loss anemia)   Hypothyroidism   DNR (do not resuscitate)  UGI Bleeding Melena Hematemesis Patient present with hematemesis x 2 and dark stools associated with weakness.  History of remote gastric ulcers.  History of gastrectomy.  Not been on PPI recently.  Currently hemodynamically stable with worsening anemia. Plan: Transfuse 1 unit PRBC IV PPI gtt. EGD today N.p.o. for procedure  Acute blood loss anemia Chronic iron deficiency Repeat iron indices May benefit from IV iron replacement Transfusion as  above Transfuse to hemoglobin goal of 7 or 8   HLD PTA statin   Hypothyroidism PTA Synthroid       DVT prophylaxis: SCD Code Status: DNR Family Communication: Son at bedside Disposition Plan: Status is: Observation The patient will require care spanning > 2 midnights and should be moved to inpatient because: Acute blood loss anemia requiring transfusion   Level of care: Progressive  Consultants:  GI  Procedures:  EGD 9/7  Antimicrobials: None seen and   Subjective: Seen and examined.  Sitting in bed.  Appears weak otherwise asymptomatic.  Objective: Vitals:   05/19/23 0817 05/19/23 0845 05/19/23 0918 05/19/23 1052  BP: (!) 141/62 (!) 121/53 117/60 (!) 124/53  Pulse: 89 86 84 82  Resp: 17 15 16 18   Temp: 97.6 F (36.4 C) 99.1 F (37.3 C) 99.4 F (37.4 C) 98.8 F (37.1 C)  TempSrc:  Oral Oral Oral  SpO2: 94% 94% 94% 95%  Weight:      Height:        Intake/Output Summary (Last 24 hours) at 05/19/2023 1337 Last data filed at 05/19/2023 1052 Gross per 24 hour  Intake 1147 ml  Output 100 ml  Net 1047 ml   Filed Weights   05/18/23 1239  Weight: 74.8 kg    Examination:  General exam: In NAD.  Appears weak Respiratory system: Lungs clear.  Normal work of breathing.  Room air Cardiovascular system: S1-S2, RRR, no murmurs, no pedal edema Gastrointestinal system: Soft, NT/ND, normal bowel sounds Central  nervous system: Alert and oriented. No focal neurological deficits. Extremities: Symmetric 5 x 5 power. Skin: No rashes, lesions or ulcers Psychiatry: Judgement and insight appear normal. Mood & affect appropriate.     Data Reviewed: I have personally reviewed following labs and imaging studies  CBC: Recent Labs  Lab 05/15/23 1706 05/18/23 1245 05/18/23 2050 05/19/23 0125 05/19/23 0721  WBC 11.7* 9.2 8.9 9.8 11.7*  NEUTROABS 9.5*  --   --   --   --   HGB 12.3 8.5* 7.1* 7.2* 6.5*  HCT 38.4 26.7* 21.3* 22.7* 20.2*  MCV 94.6 95.0 93.8 96.2 95.7   PLT 168 245 200 97* 210   Basic Metabolic Panel: Recent Labs  Lab 05/15/23 1706 05/18/23 1245 05/19/23 0711  NA 133* 137 139  K 4.0 3.7 3.9  CL 102 106 110  CO2 22 23 24   GLUCOSE 163* 132* 117*  BUN 29* 16 23  CREATININE 0.62 0.58 0.50  CALCIUM 8.6* 8.1* 7.6*   GFR: Estimated Creatinine Clearance: 64.3 mL/min (by C-G formula based on SCr of 0.5 mg/dL). Liver Function Tests: Recent Labs  Lab 05/15/23 1706 05/18/23 1245  AST 19 20  ALT 18 19  ALKPHOS 40 31*  BILITOT 0.5 0.4  PROT 6.3* 5.3*  ALBUMIN 3.8 3.0*   Recent Labs  Lab 05/18/23 1245  LIPASE 57*   No results for input(s): "AMMONIA" in the last 168 hours. Coagulation Profile: Recent Labs  Lab 05/15/23 1706  INR 1.0   Cardiac Enzymes: No results for input(s): "CKTOTAL", "CKMB", "CKMBINDEX", "TROPONINI" in the last 168 hours. BNP (last 3 results) No results for input(s): "PROBNP" in the last 8760 hours. HbA1C: No results for input(s): "HGBA1C" in the last 72 hours. CBG: No results for input(s): "GLUCAP" in the last 168 hours. Lipid Profile: No results for input(s): "CHOL", "HDL", "LDLCALC", "TRIG", "CHOLHDL", "LDLDIRECT" in the last 72 hours. Thyroid Function Tests: No results for input(s): "TSH", "T4TOTAL", "FREET4", "T3FREE", "THYROIDAB" in the last 72 hours. Anemia Panel: Recent Labs    05/18/23 2050 05/19/23 0711  FOLATE 15.8  --   FERRITIN  --  28  TIBC  --  354  IRON  --  317*   Sepsis Labs: No results for input(s): "PROCALCITON", "LATICACIDVEN" in the last 168 hours.  No results found for this or any previous visit (from the past 240 hour(s)).       Radiology Studies: No results found.      Scheduled Meds:  [MAR Hold] baclofen  10 mg Oral Daily   [MAR Hold] levothyroxine  75 mcg Oral QAC breakfast   [MAR Hold] pantoprazole  40 mg Intravenous Q12H   [MAR Hold] pravastatin  40 mg Oral QHS   [MAR Hold] sodium chloride flush  3 mL Intravenous Q12H   Continuous Infusions:   sodium chloride     [MAR Hold] ferric gluconate (FERRLECIT) IVPB Stopped (05/19/23 1248)   pantoprazole 8 mg/hr (05/19/23 1014)     LOS: 0 days     Tresa Moore, MD [facility]   If 7PM-7AM, please contact night-coverage  05/19/2023, 1:37 PM

## 2023-05-19 NOTE — Anesthesia Procedure Notes (Signed)
Procedure Name: Intubation Date/Time: 05/19/2023 1:36 PM  Performed by: Joanette Gula, Quiana Cobaugh, CRNAPre-anesthesia Checklist: Patient identified, Emergency Drugs available, Suction available and Patient being monitored Patient Re-evaluated:Patient Re-evaluated prior to induction Oxygen Delivery Method: Circle system utilized Preoxygenation: Pre-oxygenation with 100% oxygen Induction Type: IV induction Ventilation: Mask ventilation without difficulty Laryngoscope Size: McGraph and 3 Grade View: Grade I Tube type: Oral Number of attempts: 1 Airway Equipment and Method: Stylet Placement Confirmation: ETT inserted through vocal cords under direct vision, positive ETCO2 and breath sounds checked- equal and bilateral Secured at: 20 cm Tube secured with: Tape Dental Injury: Teeth and Oropharynx as per pre-operative assessment

## 2023-05-19 NOTE — Consult Note (Signed)
Arlyss Repress, MD [address]  [address]  [place], [place] [address]  Main: [phone]  Fax: [phone] Pager: [phone]   Consultation  Referring Provider:     No ref. provider found Primary Care Physician:  Jerl Mina, MD Primary Gastroenterologist:  Dr. Lannette Donath         Reason for Consultation: Hematemesis  Date of Admission:  05/18/2023 Date of Consultation:  05/19/2023         HPI:   Diane Weiss is a 72 y.o. female with history of peptic ulcer disease status post gastric surgery in her 30s, known history of upper GI bleed from gastrojejunal anastomotic ulcer diagnosed on upper endoscopy in 12/2020 presents to the ER with hematemesis, generalized weakness.  Patient originally presented to [facility] on 9/3 with coffee-ground emesis, labs at that time revealed hemoglobin 12.3, elevated BUN/creatinine 29/0.62, normal lipase, normal LFTs, she underwent CT abdomen and pelvis with contrast with no acute intra-abdominal pathology identified.  She was discharged home on omeprazole and Carafate.  She presented to [facility] ER yesterday secondary to recurrence of hematemesis and upper abdominal discomfort, melena as well as feeling lightheaded.  Labs revealed hemoglobin of 8.5, which further dropped to 6.5 today, BUN/creatinine improved and then increased again today.  She reported large episode of melena last night and second episode before coming to endoscopy unit. Patient is started on pantoprazole drip since admission.  She has been reporting intermittent heartburn.  Not on PPI as outpatient.  She does have chronic iron deficiency anemia. She received 1 unit of PRBCs today  NSAIDs: None  Antiplts/Anticoagulants/Anti thrombotics: None  GI Procedures:  Patient reports that she is due for colonoscopy in 2024   Upper endoscopy 01/05/2021 - Small hiatal hernia. - Esophagogastric landmarks identified. - Normal gastroesophageal junction and esophagus. - Patent  Billroth II gastrojejunostomy was found, characterized by ulceration. Biopsied. - Normal examined duodenum.   DIAGNOSIS:  A. 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  - NEGATIVE FOR H. PYLORI, DYSPLASIA, AND MALIGNANCY.   Past Medical History:  Diagnosis Date   Acute blood loss anemia    GI bleeding 01/04/2021   History of stomach ulcers    Hyperlipidemia    Symptomatic anemia    Thyroid disease    Vertigo    no episodes for several years   Wears dentures    partial lower    Past Surgical History:  Procedure Laterality Date   CARPAL TUNNEL RELEASE Right 07/07/2016   Procedure: OPEN CARPAL TUNNEL RELEASE;  Surgeon: Erin Sons, MD;  Location: [facility];  Service: Orthopedics;  Laterality: Right;   ESOPHAGOGASTRODUODENOSCOPY (EGD) WITH PROPOFOL N/A 01/05/2021   Procedure: ESOPHAGOGASTRODUODENOSCOPY (EGD) WITH PROPOFOL;  Surgeon: Toney Reil, MD;  Location: [facility];  Service: Gastroenterology;  Laterality: N/A;   KNEE SURGERY  1990   STOMACH SURGERY  1983   bleeding ulcers     Current Facility-Administered Medications:    0.9 %  sodium chloride infusion, , Intravenous, Continuous, Selinda Korzeniewski, Loel Dubonnet, MD, New Bag at 05/19/23 1326   [MAR Hold] acetaminophen (TYLENOL) tablet 650 mg, 650 mg, Oral, Q6H PRN **OR** [MAR Hold] acetaminophen (TYLENOL) suppository 650 mg, 650 mg, Rectal, Q6H PRN, Jonah Blue, MD   [facility] Hold] baclofen (LIORESAL) tablet 10 mg, 10 mg, Oral, Daily, Jonah Blue, MD   [facility] Hold] ferric gluconate (FERRLECIT) 250 mg in sodium chloride  0.9 % 250 mL IVPB, 250 mg, Intravenous, Daily, Tomesha Sargent, Loel Dubonnet, MD, Held at 05/19/23 1248   [MAR Hold] hydrALAZINE (APRESOLINE) injection 5 mg, 5 mg, Intravenous, Q4H PRN, Jonah Blue, MD   [facility] Hold] levothyroxine (SYNTHROID) tablet 75 mcg, 75 mcg, Oral, QAC breakfast, Jonah Blue, MD, 75 mcg  at 05/19/23 0549   [MAR Hold] morphine (PF) 2 MG/ML injection 2 mg, 2 mg, Intravenous, Q2H PRN, Jonah Blue, MD   [facility] Hold] ondansetron [facility]) tablet 4 mg, 4 mg, Oral, Q6H PRN **OR** [MAR Hold] ondansetron (ZOFRAN) injection 4 mg, 4 mg, Intravenous, Q6H PRN, Jonah Blue, MD, 4 mg at 05/18/23 2135   [MAR Hold] pantoprazole (PROTONIX) injection 40 mg, 40 mg, Intravenous, Q12H, Jonah Blue, MD   pantoprozole (PROTONIX) 80 mg /NS 100 mL infusion, 8 mg/hr, Intravenous, Continuous, Jonah Blue, MD, Last Rate: 10 mL/hr at 05/19/23 1014, 8 mg/hr at 05/19/23 1014   [MAR Hold] pravastatin (PRAVACHOL) tablet 40 mg, 40 mg, Oral, QHS, Foye Deer, RPH, 40 mg at 05/18/23 2136   [facility] Hold] sodium chloride flush (NS) 0.9 % injection 3 mL, 3 mL, Intravenous, Q12H, Jonah Blue, MD, 3 mL at 05/19/23 0913   Family History  Problem Relation Age of Onset   Hypertension Mother    Diabetes Mother    Hypertension Father    Cancer Cousin    Breast cancer Sister 66   Breast cancer Maternal Aunt 39   Breast cancer Paternal Aunt 67     Social History   Tobacco Use   Smoking status: Former    Current packs/day: 0.00    Types: Cigarettes    Quit date: 09/11/1981    Years since quitting: 41.7   Smokeless tobacco: Never  Substance Use Topics   Alcohol use: No   Drug use: No    Allergies as of 05/18/2023 - Review Complete 05/18/2023  Allergen Reaction Noted   Aspirin Other (See Comments) 06/01/2022    Review of Systems:    All systems reviewed and negative except where noted in HPI.   Physical Exam:  Vital signs in last 24 hours: Temp:  [97.6 F (36.4 C)-99.4 F (37.4 C)] 98.8 F (37.1 C) (09/07 1052) Pulse Rate:  [81-89] 82 (09/07 1052) Resp:  [13-18] 18 (09/07 1052) BP: (105-141)/(51-83) 124/53 (09/07 1052) SpO2:  [93 %-99 %] 95 % (09/07 1052) Last BM Date : 05/19/23 General:   Pleasant, cooperative in NAD Head:  Normocephalic and atraumatic. Eyes:   No icterus.    Conjunctiva pink. PERRLA. Ears:  Normal auditory acuity. Neck:  Supple; no masses or thyroidomegaly Lungs: Respirations even and unlabored. Lungs clear to auscultation bilaterally.   No wheezes, crackles, or rhonchi.  Heart:  Regular rate and rhythm;  Without murmur, clicks, rubs or gallops Abdomen:  Soft, nondistended, nontender. Normal bowel sounds. No appreciable masses or hepatomegaly.  No rebound or guarding.  Rectal:  Not performed. Msk:  Symmetrical without gross deformities.  Strength generalized weakness Extremities:  Without edema, cyanosis or clubbing. Neurologic:  Alert and oriented x3;  grossly normal neurologically. Skin:  Intact without significant lesions or rashes. Psych:  Alert and cooperative. Normal affect.  LAB RESULTS:    Latest Ref Rng & Units 05/19/2023    7:21 AM 05/19/2023    1:25 AM 05/18/2023    8:50 PM  CBC  WBC 4.0 - 10.5 K/uL 11.7  9.8  8.9   Hemoglobin 12.0 - 15.0 g/dL 6.5  7.2  7.1   Hematocrit 36.0 -  46.0 % 20.2  22.7  21.3   Platelets 150 - 400 K/uL 210  97  200     BMET    Latest Ref Rng & Units 05/19/2023    7:11 AM 05/18/2023   12:45 PM 05/15/2023    5:06 PM  BMP  Glucose 70 - 99 mg/dL 161  096  045   BUN 8 - 23 mg/dL 23  16  29    Creatinine 0.44 - 1.00 mg/dL 4.09  8.11  9.14   Sodium 135 - 145 mmol/L 139  137  133   Potassium 3.5 - 5.1 mmol/L 3.9  3.7  4.0   Chloride 98 - 111 mmol/L 110  106  102   CO2 22 - 32 mmol/L 24  23  22    Calcium 8.9 - 10.3 mg/dL 7.6  8.1  8.6     LFT    Latest Ref Rng & Units 05/18/2023   12:45 PM 05/15/2023    5:06 PM 01/04/2021    9:55 AM  Hepatic Function  Total Protein 6.5 - 8.1 g/dL 5.3  6.3  6.6   Albumin 3.5 - 5.0 g/dL 3.0  3.8  3.9   AST 15 - 41 U/L 20  19  18    ALT 0 - 44 U/L 19  18  12    Alk Phosphatase 38 - 126 U/L 31  40  36   Total Bilirubin 0.3 - 1.2 mg/dL 0.4  0.5  0.6      STUDIES: No results found.    Impression / Plan:   Diane Weiss is a 72 y.o. female with history of hypothyroidism,  peptic ulcer disease, status post Billroth II surgery in her 30s, known history of gastrojejunal anastomotic ulcer status post EGD after she presented with upper GI bleed in 12/2020 no presents with hematemesis, acute blood loss anemia, elevated BUN/creatinine ratio  Hematemesis and melena with acute blood loss anemia Elevated BUN/creatinine suggestive of acute upper GI bleed Status post 1 unit of PRBCs Maintain 2 large-bore IVs Continue pantoprazole drip Maintain n.p.o. Will proceed with upper endoscopy today  I have discussed alternative options, risks & benefits,  which include, but are not limited to, bleeding, infection, perforation,respiratory complication & drug reaction.  The patient agrees with this plan & written consent will be obtained.     Thank you for involving me in the care of this patient.      LOS: 0 days   Lannette Donath, MD  05/19/2023, 2:05 PM    Note: This dictation was prepared with Dragon dictation along with smaller phrase technology. Any transcriptional errors that result from this process are unintentional.

## 2023-05-19 NOTE — Progress Notes (Signed)
The patient refused to use the bedpan.  With my assist using the IV pole as well we went to the bathroom. Patient stated that she was dizzy as we got to the toilet. She felt nauseous, and dizzy while sitting on the toilet. She had a large BM of formed, black stool. The wheelchair was needed with 2 person assist to get the patient back into the bed safely.

## 2023-05-19 NOTE — Transfer of Care (Signed)
Immediate Anesthesia Transfer of Care Note  Patient: Diane Weiss  Procedure(s) Performed: ESOPHAGOGASTRODUODENOSCOPY (EGD) WITH PROPOFOL SUBMUCOSAL INJECTION HEMOSTASIS CLIP PLACEMENT ESOPHAGOGASTRODUODENOSCOPY (EGD) WITH PROPOFOL  Patient Location: PACU  Anesthesia Type:General  Level of Consciousness: awake, alert , and oriented  Airway & Oxygen Therapy: Patient Spontanous Breathing and Patient connected to face mask oxygen  Post-op Assessment: Report given to RN and Post -op Vital signs reviewed and stable  Post vital signs: Reviewed and stable  Last Vitals:  Vitals Value Taken Time  BP 161/70 05/19/23 1400  Temp    Pulse 92 05/19/23 1401  Resp 21 05/19/23 1401  SpO2 100 % 05/19/23 1401  Vitals shown include unfiled device data.  Last Pain:  Vitals:   05/19/23 1052  TempSrc: Oral  PainSc:       Patients Stated Pain Goal: 0 (05/18/23 2000)  Complications: No notable events documented.

## 2023-05-20 DIAGNOSIS — K922 Gastrointestinal hemorrhage, unspecified: Secondary | ICD-10-CM | POA: Diagnosis not present

## 2023-05-20 LAB — CBC
HCT: 22.7 % — ABNORMAL LOW (ref 36.0–46.0)
HCT: 21.4 % — ABNORMAL LOW (ref 36.0–46.0)
Hemoglobin: 7.2 g/dL — ABNORMAL LOW (ref 12.0–15.0)
Hemoglobin: 7 g/dL — ABNORMAL LOW (ref 12.0–15.0)
MCH: 30.5 pg (ref 26.0–34.0)
MCH: 31 pg (ref 26.0–34.0)
MCHC: 31.7 g/dL (ref 30.0–36.0)
MCHC: 32.7 g/dL (ref 30.0–36.0)
MCV: 96.2 fL (ref 80.0–100.0)
MCV: 94.7 fL (ref 80.0–100.0)
Platelets: UNDETERMINED 10*3/uL (ref 150–400)
Platelets: 209 10*3/uL (ref 150–400)
RBC: 2.36 MIL/uL — ABNORMAL LOW (ref 3.87–5.11)
RBC: 2.26 MIL/uL — ABNORMAL LOW (ref 3.87–5.11)
RDW: 13.2 % (ref 11.5–15.5)
RDW: 15.5 % (ref 11.5–15.5)
WBC: 9.8 10*3/uL (ref 4.0–10.5)
WBC: 16.4 10*3/uL — ABNORMAL HIGH (ref 4.0–10.5)
nRBC: 0 % (ref 0.0–0.2)
nRBC: 0.6 % — ABNORMAL HIGH (ref 0.0–0.2)

## 2023-05-20 LAB — BASIC METABOLIC PANEL
Anion gap: 5 (ref 5–15)
BUN: 13 mg/dL (ref 8–23)
CO2: 24 mmol/L (ref 22–32)
Calcium: 7.6 mg/dL — ABNORMAL LOW (ref 8.9–10.3)
Chloride: 109 mmol/L (ref 98–111)
Creatinine, Ser: 0.55 mg/dL (ref 0.44–1.00)
GFR, Estimated: >60 mL/min (ref >60)
Glucose, Bld: 116 mg/dL — ABNORMAL HIGH (ref 70–99)
Potassium: 4.3 mmol/L (ref 3.5–5.1)
Sodium: 138 mmol/L (ref 135–145)

## 2023-05-20 LAB — CBC WITH DIFFERENTIAL/PLATELET
Abs Immature Granulocytes: 0.15 10*3/uL — ABNORMAL HIGH (ref 0.00–0.07)
Basophils Absolute: 0 10*3/uL (ref 0.0–0.1)
Basophils Relative: 0 %
Eosinophils Absolute: 0 10*3/uL (ref 0.0–0.5)
Eosinophils Relative: 0 %
HCT: 22.5 % — ABNORMAL LOW (ref 36.0–46.0)
Hemoglobin: 7.2 g/dL — ABNORMAL LOW (ref 12.0–15.0)
Immature Granulocytes: 1 %
Lymphocytes Relative: 14 %
Lymphs Abs: 2.4 10*3/uL (ref 0.7–4.0)
MCH: 30 pg (ref 26.0–34.0)
MCHC: 32 g/dL (ref 30.0–36.0)
MCV: 93.8 fL (ref 80.0–100.0)
Monocytes Absolute: 1.1 10*3/uL — ABNORMAL HIGH (ref 0.1–1.0)
Monocytes Relative: 7 %
Neutro Abs: 13 10*3/uL — ABNORMAL HIGH (ref 1.7–7.7)
Neutrophils Relative %: 78 %
Platelets: 214 10*3/uL (ref 150–400)
RBC: 2.4 MIL/uL — ABNORMAL LOW (ref 3.87–5.11)
RDW: 15.4 % (ref 11.5–15.5)
WBC: 16.7 10*3/uL — ABNORMAL HIGH (ref 4.0–10.5)
nRBC: 0.2 % (ref 0.0–0.2)

## 2023-05-20 LAB — PREPARE RBC (CROSSMATCH)

## 2023-05-20 MED ORDER — BOOST / RESOURCE BREEZE PO LIQD CUSTOM
1.0000 | Freq: Three times a day (TID) | ORAL | Status: DC
  Administered 2023-05-20: 1 via ORAL

## 2023-05-20 MED ORDER — CYANOCOBALAMIN 1000 MCG/ML IJ SOLN
1000.0000 ug | Freq: Once | INTRAMUSCULAR | Status: AC
  Administered 2023-05-20: 1000 ug via SUBCUTANEOUS
  Filled 2023-05-20: qty 1

## 2023-05-20 MED ORDER — VITAMIN B-12 1000 MCG PO TABS
1000.0000 ug | ORAL_TABLET | Freq: Every day | ORAL | Status: DC
  Administered 2023-05-21: 1000 ug via ORAL
  Filled 2023-05-20: qty 1

## 2023-05-20 MED ORDER — SODIUM CHLORIDE 0.9% IV SOLUTION: Freq: Once | INTRAVENOUS | Status: DC

## 2023-05-20 NOTE — Progress Notes (Signed)
PROGRESS NOTE    Diane Weiss  RUE:[id number] DOB: Dec 19, 1950 DOA: 05/18/2023 PCP: Jerl Mina, MD    Brief Narrative:  72 y.o. female with medical history significant of GI bleeding from stomach ulcers in 2022, HLD, and hypothyroidism presenting with hematemesis.  She was seen at [facility] on 9/3 for weakness x 2 weeks and was concerned about possible ulcer; omeprazole was resumed and she was given Carafate and told to f/u with GI.  She reports that she has had gastric ulcers since her 54s and is s/p partial gastrectomy and banding remotely.  Her last episode was in 2022 and EGD at that time showed a patent Billroth II gastrojejunectomy with ulceration at the anastomosis.  She is due for her screening colonoscopy soon.  She takes omeprazole periodically for GERD only.  She has noticed recent UGI discomfort and started seeing some reddish emesis recently and so went to [facility] on 9/3.  She was started back on omeprazole and Carafate.  She continued to feel somewhat lightheaded over the last day or two with persistent discomfort.  This AM, she had large-volume hematemesis x 2 and so came to the ER (once at home and once here).  She currently is feeling some better.   9/7: Hemoglobin down to 6.5.  Transfusing 1 unit PRBC.  Case discussed with GI.  EGD today. 9/8: Status post EGD.  Spurting Dula for lesion noted with retained blood products in stomach.  Lesion treated.  Following morning no bleeding noted.  Hemoglobin slowly downtrending  Assessment & Plan:   Principal Problem:   Acute upper GI bleeding Active Problems:   Hyperlipidemia   ABLA (acute blood loss anemia)   Hypothyroidism   DNR (do not resuscitate)   Hematemesis with nausea   Dieulafoy lesion of stomach   Dark stools   GI bleed  UGI Bleeding Melena Hematemesis Patient present with hematemesis x 2 and dark stools associated with weakness.  History of remote gastric ulcers.  History of gastrectomy.  Not been on PPI recently.   Currently hemodynamically stable with worsening anemia. Status post EGD with bleeding visible vessel  Hemoglobin on 9/8-7.2, slowly downtrending Plan: IV PPI twice daily Recheck hemoglobin 1400 today Transfuse as needed hemoglobin less than 7 IV iron   Acute blood loss anemia Chronic iron deficiency IV iron replacement ordered   HLD Statin   Hypothyroidism Continue Synthroid       DVT prophylaxis: SCD Code Status: DNR Family Communication: Son at bedside Disposition Plan: Status is: Inpatient Remains inpatient appropriate because: GI bleed     Level of care: Progressive  Consultants:  GI  Procedures:  EGD 9/7  Antimicrobials: None   Subjective: Seen and examined.  Energy level appears improved.  Objective: Vitals:   05/19/23 2343 05/20/23 0328 05/20/23 0838 05/20/23 1139  BP: (!) 130/43 (!) 133/59 (!) 110/50 125/61  Pulse: 86 83 76 72  Resp: 18 18 16    Temp: 98.3 F (36.8 C) 98.3 F (36.8 C) 98.6 F (37 C) 98.3 F (36.8 C)  TempSrc: Oral     SpO2: 97% 95% 100% 100%  Weight:      Height:        Intake/Output Summary (Last 24 hours) at 05/20/2023 1153 Last data filed at 05/20/2023 0717 Gross per 24 hour  Intake 1054 ml  Output 1500 ml  Net -446 ml   Filed Weights   05/18/23 1239  Weight: 74.8 kg    Examination:  General exam: No acute distress Respiratory  system: Clear lungs.  Normal work of breathing.  Room air Cardiovascular system: S1-S2, RRR, no murmurs, no pedal edema Gastrointestinal system: Soft, NT/ND, normal bowel sounds Central nervous system: Alert and oriented. No focal neurological deficits. Extremities: Symmetric 5 x 5 power. Skin: No rashes, lesions or ulcers Psychiatry: Judgement and insight appear normal. Mood & affect appropriate.     Data Reviewed: I have personally reviewed following labs and imaging studies  CBC: Recent Labs  Lab 05/15/23 1706 05/18/23 1245 05/18/23 2050 05/19/23 0125 05/19/23 0721  05/19/23 1540 05/19/23 2106 05/20/23 0849  WBC 11.7*   < > 8.9 9.8 11.7* 13.3*  --  16.7*  NEUTROABS 9.5*  --   --   --   --   --   --  13.0*  HGB 12.3   < > 7.1* 7.2* 6.5* 7.7* 7.4* 7.2*  HCT 38.4   < > 21.3* 22.7* 20.2* 24.0* 22.1* 22.5*  MCV 94.6   < > 93.8 96.2 95.7 93.0  --  93.8  PLT 168   < > 200 PLATELET CLUMPS NOTED ON SMEAR, UNABLE TO ESTIMATE 210 208  --  214   < > = values in this interval not displayed.   Basic Metabolic Panel: Recent Labs  Lab 05/15/23 1706 05/18/23 1245 05/19/23 0711  NA 133* 137 139  K 4.0 3.7 3.9  CL 102 106 110  CO2 22 23 24   GLUCOSE 163* 132* 117*  BUN 29* 16 23  CREATININE 0.62 0.58 0.50  CALCIUM 8.6* 8.1* 7.6*   GFR: Estimated Creatinine Clearance: 64.3 mL/min (by C-G formula based on SCr of 0.5 mg/dL). Liver Function Tests: Recent Labs  Lab 05/15/23 1706 05/18/23 1245  AST 19 20  ALT 18 19  ALKPHOS 40 31*  BILITOT 0.5 0.4  PROT 6.3* 5.3*  ALBUMIN 3.8 3.0*   Recent Labs  Lab 05/18/23 1245  LIPASE 57*   No results for input(s): "AMMONIA" in the last 168 hours. Coagulation Profile: Recent Labs  Lab 05/15/23 1706  INR 1.0   Cardiac Enzymes: No results for input(s): "CKTOTAL", "CKMB", "CKMBINDEX", "TROPONINI" in the last 168 hours. BNP (last 3 results) No results for input(s): "PROBNP" in the last 8760 hours. HbA1C: No results for input(s): "HGBA1C" in the last 72 hours. CBG: No results for input(s): "GLUCAP" in the last 168 hours. Lipid Profile: No results for input(s): "CHOL", "HDL", "LDLCALC", "TRIG", "CHOLHDL", "LDLDIRECT" in the last 72 hours. Thyroid Function Tests: No results for input(s): "TSH", "T4TOTAL", "FREET4", "T3FREE", "THYROIDAB" in the last 72 hours. Anemia Panel: Recent Labs    05/18/23 2050 05/19/23 0711  VITAMINB12  --  182  FOLATE 15.8  --   FERRITIN  --  28  TIBC  --  354  IRON  --  317*   Sepsis Labs: No results for input(s): "PROCALCITON", "LATICACIDVEN" in the last 168 hours.  No  results found for this or any previous visit (from the past 240 hour(s)).       Radiology Studies: No results found.      Scheduled Meds:  baclofen  10 mg Oral Daily   levothyroxine  75 mcg Oral QAC breakfast   [START ON 05/22/2023] pantoprazole  40 mg Intravenous Q12H   pravastatin  40 mg Oral QHS   sodium chloride flush  3 mL Intravenous Q12H   Continuous Infusions:  ferric gluconate (FERRLECIT) IVPB Stopped (05/19/23 1248)   pantoprazole 8 mg/hr (05/20/23 0937)     LOS: 1 day  Tresa Moore, MD [facility]   If 7PM-7AM, please contact night-coverage  05/20/2023, 11:53 AM

## 2023-05-20 NOTE — Progress Notes (Signed)
Arlyss Repress, MD [address]  [address]  [place], [place] [address]  Main: [phone]  Fax: [phone] Pager: [phone]   Subjective: No acute events overnight.  She denies any further episodes of melena, hematochezia, nausea, vomiting, hematemesis.  She denies any abdominal pain.  She underwent upper endoscopy yesterday   Objective: Vital signs in last 24 hours: Vitals:   05/20/23 0328 05/20/23 0838 05/20/23 1139 05/20/23 1528  BP: (!) 133/59 (!) 110/50 125/61 (!) 124/48  Pulse: 83 76 72 75  Resp: 18 16  16   Temp: 98.3 F (36.8 C) 98.6 F (37 C) 98.3 F (36.8 C) 99 F (37.2 C)  TempSrc:      SpO2: 95% 100% 100% 95%  Weight:      Height:       Weight change:   Intake/Output Summary (Last 24 hours) at 05/20/2023 1536 Last data filed at 05/20/2023 0915 Gross per 24 hour  Intake 330 ml  Output 1500 ml  Net -1170 ml     Exam: Heart: Regular rate and rhythm, S1S2 present, or without murmur or extra heart sounds Lungs: normal and clear to auscultation Abdomen: soft, nontender, normal bowel sounds   Lab Results:    Latest Ref Rng & Units 05/20/2023    2:07 PM 05/20/2023    8:49 AM 05/19/2023    9:06 PM  CBC  WBC 4.0 - 10.5 K/uL 16.4  16.7    Hemoglobin 12.0 - 15.0 g/dL 7.0  7.2  7.4   Hematocrit 36.0 - 46.0 % 21.4  22.5  22.1   Platelets 150 - 400 K/uL 209  214        Latest Ref Rng & Units 05/20/2023    2:29 PM 05/19/2023    7:11 AM 05/18/2023   12:45 PM  CMP  Glucose 70 - 99 mg/dL 254  270  623   BUN 8 - 23 mg/dL 13  23  16    Creatinine 0.44 - 1.00 mg/dL 7.62  8.31  5.17   Sodium 135 - 145 mmol/L 138  139  137   Potassium 3.5 - 5.1 mmol/L 4.3  3.9  3.7   Chloride 98 - 111 mmol/L 109  110  106   CO2 22 - 32 mmol/L 24  24  23    Calcium 8.9 - 10.3 mg/dL 7.6  7.6  8.1   Total Protein 6.5 - 8.1 g/dL   5.3   Total Bilirubin 0.3 - 1.2 mg/dL   0.4   Alkaline Phos 38 - 126 U/L   31   AST 15 - 41 U/L   20   ALT 0 - 44 U/L   19     Micro  Results: No results found for this or any previous visit (from the past 240 hour(s)). Studies/Results: No results found. Medications: I have reviewed the patient's current medications. Prior to Admission:  Medications Prior to Admission  Medication Sig Dispense Refill Last Dose   acetaminophen (TYLENOL) 325 MG tablet Take 650 mg by mouth daily as needed for mild pain or headache.   prn at unk   baclofen (LIORESAL) 10 MG tablet Take 10 mg by mouth daily.   05/17/2023   Cholecalciferol (D3) 50 MCG (2000 UT) TABS Take 2,000 Units by mouth at bedtime.   05/17/2023   levothyroxine (SYNTHROID, LEVOTHROID) 75 MCG tablet Take 75 mcg by mouth daily before breakfast.   05/17/2023   lovastatin (ALTOPREV) 40 MG 24 hr tablet Take 40 mg  by mouth at bedtime.   05/17/2023   Lutein 20 MG TABS Take 20 mg by mouth daily.   05/17/2023   Multiple Vitamin (MULTI-VITAMIN) tablet Take 1 tablet by mouth daily.   05/17/2023   Na Sulfate-K Sulfate-Mg Sulf 17.5-3.13-1.6 GM/177ML SOLN Take 1 Bottle by mouth as directed.   05/17/2023   omeprazole (PRILOSEC) 20 MG capsule Take 2 capsules (40 mg total) by mouth daily. 60 capsule 0 05/18/2023   polyethylene glycol (MIRALAX) 17 g packet Take 17 g by mouth daily as needed for moderate constipation. 14 each 0 prn at unk   sucralfate (CARAFATE) 1 g tablet Take 1 tablet (1 g total) by mouth 4 (four) times daily -  with meals and at bedtime. 120 tablet 0 05/18/2023   traMADol (ULTRAM) 50 MG tablet Take 50 mg by mouth 2 (two) times daily.   05/17/2023   Scheduled:  sodium chloride   Intravenous Once   baclofen  10 mg Oral Daily   [START ON 05/21/2023] vitamin B-12  1,000 mcg Oral Daily   feeding supplement  1 Container Oral TID BM   levothyroxine  75 mcg Oral QAC breakfast   [START ON 05/22/2023] pantoprazole  40 mg Intravenous Q12H   pravastatin  40 mg Oral QHS   sodium chloride flush  3 mL Intravenous Q12H   Continuous:  ferric gluconate (FERRLECIT) IVPB 250 mg (05/20/23 1237)   pantoprazole 8  mg/hr (05/20/23 0937)   XHB:[id number] **OR** acetaminophen, hydrALAZINE, morphine injection, ondansetron **OR** ondansetron (ZOFRAN) IV Anti-infectives (From admission, onward)    None      Scheduled Meds:  sodium chloride   Intravenous Once   baclofen  10 mg Oral Daily   [START ON 05/21/2023] vitamin B-12  1,000 mcg Oral Daily   feeding supplement  1 Container Oral TID BM   levothyroxine  75 mcg Oral QAC breakfast   [START ON 05/22/2023] pantoprazole  40 mg Intravenous Q12H   pravastatin  40 mg Oral QHS   sodium chloride flush  3 mL Intravenous Q12H   Continuous Infusions:  ferric gluconate (FERRLECIT) IVPB 250 mg (05/20/23 1237)   pantoprazole 8 mg/hr (05/20/23 0937)   PRN Meds:.acetaminophen **OR** acetaminophen, hydrALAZINE, morphine injection, ondansetron **OR** ondansetron (ZOFRAN) IV   Assessment: Principal Problem:   Acute upper GI bleeding Active Problems:   Hyperlipidemia   ABLA (acute blood loss anemia)   Hypothyroidism   DNR (do not resuscitate)   Hematemesis with nausea   Dieulafoy lesion of stomach   Dark stools   GI bleed  Diane Weiss is a 72 y.o. 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, known history of gastrojejunal anastomotic ulcer status post EGD after she presented with upper GI bleed in 12/2020 no presents with hematemesis, acute blood loss anemia, elevated BUN/creatinine ratio    Plan: Acute upper GI bleed, hematemesis and melena S/p EGD on 9/7, found to have actively bleeding Dieulafoy's lesion in the stomach s/p APC and hemostatic clips No further episodes of hematemesis or melena since EGD Status post 1 unit of PRBCs on 9/7 BUN/creatinine ratio is normalized Hemoglobin is 7 today, recommend another unit of PRBCs Monitor CBC closely to maintain hemoglobin above 7 Continue pantoprazole drip for 72 hours total Advance to full liquids today Continue IV iron Recommend long-term  omeprazole 40 mg 1-2 times daily before meals indefinitely upon discharge She will need close follow-up with [facility] as outpatient  Dr. Tobi Bastos will  cover from tomorrow   LOS: 1 day   Kalisi Bevill 05/20/2023, 3:36 PM

## 2023-05-20 NOTE — Plan of Care (Signed)

## 2023-05-21 ENCOUNTER — Encounter: Payer: Self-pay | Admitting: Gastroenterology

## 2023-05-21 ENCOUNTER — Telehealth: Payer: Self-pay

## 2023-05-21 DIAGNOSIS — K922 Gastrointestinal hemorrhage, unspecified: Secondary | ICD-10-CM | POA: Diagnosis not present

## 2023-05-21 LAB — TYPE AND SCREEN
ABO/RH(D): A POS
Antibody Screen: NEGATIVE
Unit division: 0
Unit division: 0

## 2023-05-21 LAB — CBC WITH DIFFERENTIAL/PLATELET
Abs Immature Granulocytes: 0.21 10*3/uL — ABNORMAL HIGH (ref 0.00–0.07)
Basophils Absolute: 0 10*3/uL (ref 0.0–0.1)
Basophils Relative: 0 %
Eosinophils Absolute: 0.1 10*3/uL (ref 0.0–0.5)
Eosinophils Relative: 1 %
HCT: 25 % — ABNORMAL LOW (ref 36.0–46.0)
Hemoglobin: 8.3 g/dL — ABNORMAL LOW (ref 12.0–15.0)
Immature Granulocytes: 2 %
Lymphocytes Relative: 18 %
Lymphs Abs: 1.9 10*3/uL (ref 0.7–4.0)
MCH: 31 pg (ref 26.0–34.0)
MCHC: 33.2 g/dL (ref 30.0–36.0)
MCV: 93.3 fL (ref 80.0–100.0)
Monocytes Absolute: 0.9 10*3/uL (ref 0.1–1.0)
Monocytes Relative: 8 %
Neutro Abs: 7.8 10*3/uL — ABNORMAL HIGH (ref 1.7–7.7)
Neutrophils Relative %: 71 %
Platelets: 190 10*3/uL (ref 150–400)
RBC: 2.68 MIL/uL — ABNORMAL LOW (ref 3.87–5.11)
RDW: 15.7 % — ABNORMAL HIGH (ref 11.5–15.5)
WBC: 10.9 10*3/uL — ABNORMAL HIGH (ref 4.0–10.5)
nRBC: 1.8 % — ABNORMAL HIGH (ref 0.0–0.2)

## 2023-05-21 LAB — BPAM RBC
Blood Product Expiration Date: 202409292359
Blood Product Expiration Date: 202409292359
ISSUE DATE / TIME: 202409070854
ISSUE DATE / TIME: 202409081846
Unit Type and Rh: 6200
Unit Type and Rh: 6200

## 2023-05-21 LAB — BASIC METABOLIC PANEL
Anion gap: 6 (ref 5–15)
BUN: 12 mg/dL (ref 8–23)
CO2: 24 mmol/L (ref 22–32)
Calcium: 7.6 mg/dL — ABNORMAL LOW (ref 8.9–10.3)
Chloride: 108 mmol/L (ref 98–111)
Creatinine, Ser: 0.56 mg/dL (ref 0.44–1.00)
GFR, Estimated: >60 mL/min (ref >60)
Glucose, Bld: 100 mg/dL — ABNORMAL HIGH (ref 70–99)
Potassium: 3.6 mmol/L (ref 3.5–5.1)
Sodium: 138 mmol/L (ref 135–145)

## 2023-05-21 LAB — HEMOGLOBIN AND HEMATOCRIT, BLOOD
HCT: 26.9 % — ABNORMAL LOW (ref 36.0–46.0)
Hemoglobin: 8.8 g/dL — ABNORMAL LOW (ref 12.0–15.0)

## 2023-05-21 MED ORDER — OMEPRAZOLE 40 MG PO CPDR: 40.0000 mg | DELAYED_RELEASE_CAPSULE | Freq: Two times a day (BID) | ORAL | 0 refills | Status: DC

## 2023-05-21 MED ORDER — PANTOPRAZOLE SODIUM 40 MG PO TBEC
40.0000 mg | DELAYED_RELEASE_TABLET | Freq: Two times a day (BID) | ORAL | Status: DC
  Administered 2023-05-21: 40 mg via ORAL
  Filled 2023-05-21: qty 1

## 2023-05-21 MED ORDER — CYANOCOBALAMIN 1000 MCG PO TABS: 1000.0000 ug | ORAL_TABLET | Freq: Every day | ORAL | 0 refills | Status: AC

## 2023-05-21 NOTE — Group Note (Deleted)

## 2023-05-21 NOTE — Telephone Encounter (Signed)
Patient is still admitted into the hospital will call back when patient is discharge

## 2023-05-21 NOTE — Telephone Encounter (Signed)
-----   Message from [facility] sent at 05/20/2023  2:29 PM EDT ----- Regarding: hospital follow up She needs to seen in 2 weeks with [facility] follow up for acute upper GI bleed Also, schedule screening colonoscopy with me in 1 month She has to cancel her appt with [facility]  Thanks RV

## 2023-05-21 NOTE — TOC CM/SW Note (Signed)
Transition of Care [facility]) - Inpatient Brief Assessment   Patient Details  Name: Diane Weiss MRN: [id number] Date of Birth: 02/03/1951  Transition of Care [facility]) CM/SW Contact:    Kreg Shropshire, RN Phone Number: 05/21/2023, 1:57 PM   Clinical Narrative:  D/c order in. Brief assessment completed. No needs at this time. TOC signing off  Transition of Care Asessment: Insurance and Status: Insurance coverage has been reviewed Patient has primary care physician: Yes     Prior/Current Home Services: No current home services Social Determinants of Health Reivew: SDOH reviewed no interventions necessary Readmission risk has been reviewed: Yes Transition of care needs: no transition of care needs at this time

## 2023-05-21 NOTE — Discharge Summary (Signed)
Physician Discharge Summary  Diane Weiss:[id number] DOB: [birth year] DOA: 05/18/2023  PCP: Jerl Mina, MD  Admit date: 05/18/2023 Discharge date: 05/21/2023  Admitted From: Home Disposition:  Home  Recommendations for Outpatient Follow-up:  Follow up with PCP in 1-2 weeks Follow-up with [facility]  Home Health: No Equipment/Devices: None  Discharge Condition: Stable CODE STATUS: DNR Diet recommendation: Regular  Brief/Interim Summary:  72 y.o. female with medical history significant of GI bleeding from stomach ulcers in 2022, HLD, and hypothyroidism presenting with hematemesis.  She was seen at [facility] on 9/3 for weakness x 2 weeks and was concerned about possible ulcer; omeprazole was resumed and she was given Carafate and told to f/u with GI.  She reports that she has had gastric ulcers since her 24s and is s/p partial gastrectomy and banding remotely.  Her last episode was in 2022 and EGD at that time showed a patent Billroth II gastrojejunectomy with ulceration at the anastomosis.  She is due for her screening colonoscopy soon.  She takes omeprazole periodically for GERD only.  She has noticed recent UGI discomfort and started seeing some reddish emesis recently and so went to [facility] on 9/3.  She was started back on omeprazole and Carafate.  She continued to feel somewhat lightheaded over the last day or two with persistent discomfort.  This AM, she had large-volume hematemesis x 2 and so came to the ER (once at home and once here).  She currently is feeling some better.    9/7: Hemoglobin down to 6.5.  Transfusing 1 unit PRBC.  Case discussed with GI.  EGD today. 9/8: Status post EGD.  Spurting Dula for lesion noted with retained blood products in stomach.  Lesion treated.  Following morning no bleeding noted.  Hemoglobin slowly downtrending 9/9: Tolerating regular diet without nausea or vomiting.  Follow-up hemoglobin 8.8.  No evidence of acute blood loss.  Stable for discharge.   Recommend twice daily PPI DC.  Follow-up outpatient [facility].    Discharge Diagnoses:  Principal Problem:   Acute upper GI bleeding Active Problems:   Hyperlipidemia   ABLA (acute blood loss anemia)   Hypothyroidism   DNR (do not resuscitate)   Hematemesis with nausea   Dieulafoy lesion of stomach   Dark stools   GI bleed   UGI Bleeding Melena Hematemesis Patient present with hematemesis x 2 and dark stools associated with weakness.  History of remote gastric ulcers.  History of gastrectomy.  Not been on PPI recently.  Currently hemodynamically stable with worsening anemia. Status post EGD with bleeding visible vessel  Hemoglobin on 9/8-7.2, slowly downtrending Plan: Hemoglobin stable at time of DC.  Recommend p.o. PPI twice daily.  Stable for discharge home.  Follow-up outpatient PCP and [facility]     Acute blood loss anemia Chronic iron deficiency IV iron replacement ordered Discharge on B12 supplementation   HLD Statin   Hypothyroidism Continue Synthroid  Discharge Instructions  Discharge Instructions     Diet - low sodium heart healthy   Complete by: As directed    Increase activity slowly   Complete by: As directed       Allergies as of 05/21/2023       Reactions   Aspirin Other (See Comments)   Had stomach surgery in the past, may cause bleeding        Medication List     TAKE these medications    acetaminophen 325 MG tablet Commonly known as: TYLENOL Take 650 mg  by mouth daily as needed for mild pain or headache.   baclofen 10 MG tablet Commonly known as: LIORESAL Take 10 mg by mouth daily.   cyanocobalamin 1000 MCG tablet Take 1 tablet (1,000 mcg total) by mouth daily. Start taking on: May 22, 2023   D3 50 MCG (2000 UT) Tabs Generic drug: Cholecalciferol Take 2,000 Units by mouth at bedtime.   levothyroxine 75 MCG tablet Commonly known as: SYNTHROID Take 75 mcg by mouth daily before breakfast.   lovastatin 40 MG 24  hr tablet Commonly known as: ALTOPREV Take 40 mg by mouth at bedtime.   Lutein 20 MG Tabs Take 20 mg by mouth daily.   Multi-Vitamin tablet Take 1 tablet by mouth daily.   Na Sulfate-K Sulfate-Mg Sulf 17.5-3.13-1.6 GM/177ML Soln Take 1 Bottle by mouth as directed.   omeprazole 40 MG capsule Commonly known as: PRILOSEC Take 1 capsule (40 mg total) by mouth 2 (two) times daily before a meal. What changed:  medication strength when to take this   polyethylene glycol 17 g packet Commonly known as: MiraLax Take 17 g by mouth daily as needed for moderate constipation.   sucralfate 1 g tablet Commonly known as: Carafate Take 1 tablet (1 g total) by mouth 4 (four) times daily -  with meals and at bedtime.   traMADol 50 MG tablet Commonly known as: ULTRAM Take 50 mg by mouth 2 (two) times daily.        Allergies  Allergen Reactions   Aspirin Other (See Comments)    Had stomach surgery in the past, may cause bleeding    Consultations: GI   Procedures/Studies: CT ABDOMEN PELVIS W CONTRAST  Result Date: 05/15/2023 CLINICAL DATA:  Epigastric pain, generalized weakness, dark colored acid reflux and history of bleeding ulcers EXAM: CT ABDOMEN AND PELVIS WITH CONTRAST TECHNIQUE: Multidetector CT imaging of the abdomen and pelvis was performed using the standard protocol following bolus administration of intravenous contrast. RADIATION DOSE REDUCTION: This exam was performed according to the departmental dose-optimization program which includes automated exposure control, adjustment of the mA and/or kV according to patient size and/or use of iterative reconstruction technique. CONTRAST:  OMNIPAQUE IOHEXOL 300 MG/ML  SOLN COMPARISON:  None Available. FINDINGS: Lower chest: No acute abnormality. Hepatobiliary: Unremarkable liver. Normal gallbladder. No biliary dilation. Pancreas: Unremarkable. Spleen: Unremarkable. Adrenals/Urinary Tract: Normal adrenal glands. No urinary calculi  or hydronephrosis. Bladder is unremarkable. Stomach/Bowel: Postoperative change of gastric bypass. Normal caliber large and small bowel. No bowel wall thickening. The appendix is normal. Vascular/Lymphatic: No significant vascular findings are present. No enlarged abdominal or pelvic lymph nodes. Reproductive: Focus of enhancement in the uterine fundus presumably due to uterine fibroid. No suspicious adnexal mass. Other: No free intraperitoneal fluid or air. Musculoskeletal: No acute fracture. IMPRESSION: 1. No acute abnormality in the abdomen or pelvis. 2. Postoperative change of gastric bypass. Electronically Signed   By: Minerva Fester M.D.   On: 05/15/2023 20:22      Subjective: Seen and examined on the day of discharge.  Stable no distress.  Appropriate for discharge home.  Discharge Exam: Vitals:   05/21/23 0813 05/21/23 1205  BP: 123/63 (!) 121/56  Pulse: 86 72  Resp:  18  Temp:  98.2 F (36.8 C)  SpO2: 97% 95%   Vitals:   05/21/23 0409 05/21/23 0811 05/21/23 0813 05/21/23 1205  BP: 135/62 (!) 142/59 123/63 (!) 121/56  Pulse: 70 72 86 72  Resp: 18   18  Temp: 99.2 F (  37.3 C) 98.1 F (36.7 C)  98.2 F (36.8 C)  TempSrc:      SpO2: 94% 94% 97% 95%  Weight:      Height:        General: Pt is alert, awake, not in acute distress Cardiovascular: RRR, S1/S2 +, no rubs, no gallops Respiratory: CTA bilaterally, no wheezing, no rhonchi Abdominal: Soft, NT, ND, bowel sounds + Extremities: no edema, no cyanosis    The results of significant diagnostics from this hospitalization (including imaging, microbiology, ancillary and laboratory) are listed below for reference.     Microbiology: No results found for this or any previous visit (from the past 240 hour(s)).   Labs: BNP (last 3 results) No results for input(s): "BNP" in the last 8760 hours. Basic Metabolic Panel: Recent Labs  Lab 05/15/23 1706 05/18/23 1245 05/19/23 0711 05/20/23 1429 05/21/23 0455  NA 133*  137 139 138 138  K 4.0 3.7 3.9 4.3 3.6  CL 102 106 110 109 108  CO2 22 23 24 24 24   GLUCOSE 163* 132* 117* 116* 100*  BUN 29* 16 23 13 12   CREATININE 0.62 0.58 0.50 0.55 0.56  CALCIUM 8.6* 8.1* 7.6* 7.6* 7.6*   Liver Function Tests: Recent Labs  Lab 05/15/23 1706 05/18/23 1245  AST 19 20  ALT 18 19  ALKPHOS 40 31*  BILITOT 0.5 0.4  PROT 6.3* 5.3*  ALBUMIN 3.8 3.0*   Recent Labs  Lab 05/18/23 1245  LIPASE 57*   No results for input(s): "AMMONIA" in the last 168 hours. CBC: Recent Labs  Lab 05/15/23 1706 05/18/23 1245 05/19/23 0721 05/19/23 1540 05/19/23 2106 05/20/23 0849 05/20/23 1407 05/21/23 0455 05/21/23 1237  WBC 11.7*   < > 11.7* 13.3*  --  16.7* 16.4* 10.9*  --   NEUTROABS 9.5*  --   --   --   --  13.0*  --  7.8*  --   HGB 12.3   < > 6.5* 7.7* 7.4* 7.2* 7.0* 8.3* 8.8*  HCT 38.4   < > 20.2* 24.0* 22.1* 22.5* 21.4* 25.0* 26.9*  MCV 94.6   < > 95.7 93.0  --  93.8 94.7 93.3  --   PLT 168   < > 210 208  --  214 209 190  --    < > = values in this interval not displayed.   Cardiac Enzymes: No results for input(s): "CKTOTAL", "CKMB", "CKMBINDEX", "TROPONINI" in the last 168 hours. BNP: Invalid input(s): "POCBNP" CBG: No results for input(s): "GLUCAP" in the last 168 hours. D-Dimer No results for input(s): "DDIMER" in the last 72 hours. Hgb A1c No results for input(s): "HGBA1C" in the last 72 hours. Lipid Profile No results for input(s): "CHOL", "HDL", "LDLCALC", "TRIG", "CHOLHDL", "LDLDIRECT" in the last 72 hours. Thyroid function studies No results for input(s): "TSH", "T4TOTAL", "T3FREE", "THYROIDAB" in the last 72 hours.  Invalid input(s): "FREET3" Anemia work up Recent Labs    05/18/23 2050 05/19/23 0711  VITAMINB12  --  182  FOLATE 15.8  --   FERRITIN  --  28  TIBC  --  354  IRON  --  317*   Urinalysis    Component Value Date/Time   COLORURINE YELLOW 05/15/2023 1729   APPEARANCEUR CLEAR 05/15/2023 1729   LABSPEC 1.018 05/15/2023 1729    PHURINE 5.0 05/15/2023 1729   GLUCOSEU NEGATIVE 05/15/2023 1729   HGBUR NEGATIVE 05/15/2023 1729   BILIRUBINUR NEGATIVE 05/15/2023 1729   KETONESUR NEGATIVE 05/15/2023 1729   PROTEINUR  NEGATIVE 05/15/2023 1729   NITRITE NEGATIVE 05/15/2023 1729   LEUKOCYTESUR NEGATIVE 05/15/2023 1729   Sepsis Labs Recent Labs  Lab 05/19/23 1540 05/20/23 0849 05/20/23 1407 05/21/23 0455  WBC 13.3* 16.7* 16.4* 10.9*   Microbiology No results found for this or any previous visit (from the past 240 hour(s)).   Time coordinating discharge: Over 30 minutes  SIGNED:   Tresa Moore, MD  [facility] 05/21/2023, 1:24 PM Pager   If 7PM-7AM, please contact night-coverage

## 2023-05-22 LAB — VITAMIN B1: Vitamin B1 (Thiamine): 87.5 nmol/L (ref 66.5–200.0)

## 2023-05-22 NOTE — Telephone Encounter (Signed)
Called and made hospital follow up with Diane Weiss for 06/05/2023 at 11:15 and colonoscopy will be schedule at the appointment patient did not want to schedule it over the phone

## 2023-05-25 ENCOUNTER — Other Ambulatory Visit: Payer: Self-pay | Admitting: Student

## 2023-05-25 ENCOUNTER — Ambulatory Visit
Admission: RE | Admit: 2023-05-25 | Discharge: 2023-05-25 | Disposition: A | Discharge status: Home / Self Care | Payer: Medicare HMO | Source: Ambulatory Visit | Attending: Student | Admitting: Student

## 2023-05-25 DIAGNOSIS — M79661 Pain in right lower leg: Secondary | ICD-10-CM | POA: Insufficient documentation

## 2023-06-04 NOTE — Progress Notes (Unsigned)
Celso Amy, PA-C [address]  [address]  [place], [place] [address]  Main: [phone]  Fax: [phone]   Primary Care Physician: Jerl Mina, MD  Primary Gastroenterologist:  Celso Amy, PA-C / Dr. Lannette Donath    CC: Hospital follow-up acute upper GI bleed and Anemia.  HPI: Diane Weiss is a 72 y.o. female returns for hospital follow-up of upper GI bleed.  Hospitalized at [facility] 05/18/2023 until 05/21/2023 for upper GI bleed and acute blood loss anemia.  Presented with hematemesis and epigastric pain.  Initial hemoglobin 6.5.  Transfused 1 unit PRBC.  Hemoglobin improved to 8.8 at discharge.  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  EGD 12/2020 showed biopsies negative for H. pylori.  EGD done by Dr. 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  Normal GE junction and esophagus.  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  3 hemostatic clips were placed to stop active bleeding.  Epinephrine injected for hemostasis.  She was given IV iron in hospital.  Discharged from hospital on omeprazole 40 Mg twice daily, vitamin B-12 1000 mcg daily, sucralfate 1 g 4 times daily as needed, MiraLAX as needed.  Last screening colonoscopy 12/2012 was normal.  10-year repeat screening colonoscopy is overdue.  She has history of chronic constipation and takes MiraLAX.  Current Outpatient Medications  Medication Sig Dispense Refill   acetaminophen (TYLENOL) 325 MG tablet Take 650 mg by mouth daily as needed for mild pain or headache.     baclofen (LIORESAL) 10 MG tablet Take 10 mg by mouth daily.     Cholecalciferol (D3) 50 MCG (2000 UT) TABS Take 2,000 Units by mouth at bedtime.     cyanocobalamin 1000 MCG tablet Take 1 tablet (1,000 mcg total) by mouth daily. 60 tablet 0   levothyroxine (SYNTHROID, LEVOTHROID) 75 MCG tablet Take 75 mcg by mouth daily before breakfast.     lovastatin (ALTOPREV) 40  MG 24 hr tablet Take 40 mg by mouth at bedtime.     Lutein 20 MG TABS Take 20 mg by mouth daily.     Multiple Vitamin (MULTI-VITAMIN) tablet Take 1 tablet by mouth daily.     Na Sulfate-K Sulfate-Mg Sulf 17.5-3.13-1.6 GM/177ML SOLN Take 1 Bottle by mouth as directed.     omeprazole (PRILOSEC) 40 MG capsule Take 1 capsule (40 mg total) by mouth 2 (two) times daily before a meal. 120 capsule 0   polyethylene glycol (MIRALAX) 17 g packet Take 17 g by mouth daily as needed for moderate constipation. 14 each 0   sucralfate (CARAFATE) 1 g tablet Take 1 tablet (1 g total) by mouth 4 (four) times daily -  with meals and at bedtime. 120 tablet 0   traMADol (ULTRAM) 50 MG tablet Take 50 mg by mouth 2 (two) times daily.     No current facility-administered medications for this visit.    Allergies as of 06/05/2023 - Review Complete 05/19/2023  Allergen Reaction Noted   Aspirin Other (See Comments) 06/01/2022    Past Medical History:  Diagnosis Date   Acute blood loss anemia    GI bleeding 01/04/2021   History of stomach ulcers    Hyperlipidemia    Symptomatic anemia    Thyroid disease    Vertigo    no episodes for several years   Wears dentures    partial lower    Past Surgical History:  Procedure Laterality Date   CARPAL  TUNNEL RELEASE Right 07/07/2016   Procedure: OPEN CARPAL TUNNEL RELEASE;  Surgeon: Erin Sons, MD;  Location: [facility];  Service: Orthopedics;  Laterality: Right;   ESOPHAGOGASTRODUODENOSCOPY (EGD) WITH PROPOFOL N/A 01/05/2021   Procedure: ESOPHAGOGASTRODUODENOSCOPY (EGD) WITH PROPOFOL;  Surgeon: Toney Reil, MD;  Location: [facility] ENDOSCOPY;  Service: Gastroenterology;  Laterality: N/A;   ESOPHAGOGASTRODUODENOSCOPY (EGD) WITH PROPOFOL N/A 05/19/2023   Procedure: ESOPHAGOGASTRODUODENOSCOPY (EGD) WITH PROPOFOL;  Surgeon: Toney Reil, MD;  Location: [facility] ENDOSCOPY;  Service: Gastroenterology;  Laterality: N/A;   HEMOSTASIS CLIP PLACEMENT  05/19/2023    Procedure: HEMOSTASIS CLIP PLACEMENT;  Surgeon: Toney Reil, MD;  Location: [facility] ENDOSCOPY;  Service: Gastroenterology;;   KNEE SURGERY  1990   STOMACH SURGERY  1983   bleeding ulcers   SUBMUCOSAL INJECTION  05/19/2023   Procedure: SUBMUCOSAL INJECTION;  Surgeon: Toney Reil, MD;  Location: [facility] ENDOSCOPY;  Service: Gastroenterology;;    Review of Systems:    All systems reviewed and negative except where noted in HPI.   Physical Examination:   There were no vitals taken for this visit.  General: Well-nourished, well-developed in no acute distress.  Lungs: Clear to auscultation bilaterally. Non-labored. Heart: Regular rate and rhythm, no murmurs rubs or gallops.  Abdomen: Bowel sounds are normal; Abdomen is Soft; No hepatosplenomegaly, masses or hernias;  No Abdominal Tenderness; No guarding or rebound tenderness. Neuro: Alert and oriented x 3.  Grossly intact.  Psych: Alert and cooperative, normal mood and affect.   Imaging Studies: US Venous Img Lower Unilateral Right (DVT)  Result Date: 05/25/2023 CLINICAL DATA:  Right lower extremity calf pain for the past 2 days. Evaluate for DVT. EXAM: RIGHT LOWER EXTREMITY VENOUS DOPPLER ULTRASOUND TECHNIQUE: Gray-scale sonography with graded compression, as well as color Doppler and duplex ultrasound were performed to evaluate the lower extremity deep venous systems from the level of the common femoral vein and including the common femoral, femoral, profunda femoral, popliteal and calf veins including the posterior tibial, peroneal and gastrocnemius veins when visible. The superficial great saphenous vein was also interrogated. Spectral Doppler was utilized to evaluate flow at rest and with distal augmentation maneuvers in the common femoral, femoral and popliteal veins. COMPARISON:  None Available. FINDINGS: Contralateral Common Femoral Vein: Respiratory phasicity is normal and symmetric with the symptomatic side. No evidence of  thrombus. Normal compressibility. Common Femoral Vein: No evidence of thrombus. Normal compressibility, respiratory phasicity and response to augmentation. Saphenofemoral Junction: No evidence of thrombus. Normal compressibility and flow on color Doppler imaging. Profunda Femoral Vein: No evidence of thrombus. Normal compressibility and flow on color Doppler imaging. Femoral Vein: No evidence of thrombus. Normal compressibility, respiratory phasicity and response to augmentation. Popliteal Vein: No evidence of thrombus. Normal compressibility, respiratory phasicity and response to augmentation. Calf Veins: No evidence of thrombus. Normal compressibility and flow on color Doppler imaging. Superficial Great Saphenous Vein: No evidence of thrombus. Normal compressibility. Other Findings: There is a 5.2 x 5.1 x 1.4 cm fluid collection within the right popliteal fossa compatible with a Baker's cyst. IMPRESSION: 1. No evidence of DVT within the right lower extremity. 2. Note made of a 5.2 cm right-sided Baker's cyst. Electronically Signed   By: Simonne Come M.D.   On: 05/25/2023 10:59   CT ABDOMEN PELVIS W CONTRAST  Result Date: 05/15/2023 CLINICAL DATA:  Epigastric pain, generalized weakness, dark colored acid reflux and history of bleeding ulcers EXAM: CT ABDOMEN AND PELVIS WITH CONTRAST TECHNIQUE: Multidetector CT imaging of the abdomen and pelvis  was performed using the standard protocol following bolus administration of intravenous contrast. RADIATION DOSE REDUCTION: This exam was performed according to the departmental dose-optimization program which includes automated exposure control, adjustment of the mA and/or kV according to patient size and/or use of iterative reconstruction technique. CONTRAST:  OMNIPAQUE IOHEXOL 300 MG/ML  SOLN COMPARISON:  None Available. FINDINGS: Lower chest: No acute abnormality. Hepatobiliary: Unremarkable liver. Normal gallbladder. No biliary dilation. Pancreas: Unremarkable.  Spleen: Unremarkable. Adrenals/Urinary Tract: Normal adrenal glands. No urinary calculi or hydronephrosis. Bladder is unremarkable. Stomach/Bowel: Postoperative change of gastric bypass. Normal caliber large and small bowel. No bowel wall thickening. The appendix is normal. Vascular/Lymphatic: No significant vascular findings are present. No enlarged abdominal or pelvic lymph nodes. Reproductive: Focus of enhancement in the uterine fundus presumably due to uterine fibroid. No suspicious adnexal mass. Other: No free intraperitoneal fluid or air. Musculoskeletal: No acute fracture. IMPRESSION: 1. No acute abnormality in the abdomen or pelvis. 2. Postoperative change of gastric bypass. Electronically Signed   By: Minerva Fester M.D.   On: 05/15/2023 20:22    Assessment and Plan:   Diane Weiss is a 72 y.o. y/o female presents for hospital follow-up of recent acute upper GI bleed with acute blood loss anemia.  EGD done by Dr. 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  Normal GE junction and esophagus.  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  3 hemostatic clips were placed to stop active bleeding.   1.  Recent upper GI bleed  Continue twice daily PPI  Avoid NSAIDs  2.  Acute blood loss Anemia, iron deficiency, B12 deficiency  Labs: CBC, iron panel, ferritin, B12    Continue vitamin B12, iron  3.  Chronic constipation  Continue Miralax  4.  Colon cancer screening; normal colonoscopy 12/2012.  Due for 10-year repeat screening.  Scheduling Colonoscopy I discussed risks of colonoscopy with patient to include risk of bleeding, colon perforation, and risk of sedation.  Patient expressed understanding and agrees to proceed with colonoscopy.     Celso Amy, PA-C  Follow up ***  BP check ***

## 2023-06-05 ENCOUNTER — Ambulatory Visit: Payer: Medicare HMO | Admitting: Physician Assistant

## 2023-06-05 ENCOUNTER — Encounter: Payer: Self-pay | Admitting: Physician Assistant

## 2023-06-05 VITALS — BP 126/70 | HR 79 | Temp 98.4°F | Ht 65.0 in | Wt 169.4 lb

## 2023-06-05 DIAGNOSIS — K5904 Chronic idiopathic constipation: Secondary | ICD-10-CM

## 2023-06-05 DIAGNOSIS — Z1211 Encounter for screening for malignant neoplasm of colon: Secondary | ICD-10-CM

## 2023-06-05 DIAGNOSIS — K3182 Dieulafoy lesion (hemorrhagic) of stomach and duodenum: Secondary | ICD-10-CM | POA: Diagnosis not present

## 2023-06-05 DIAGNOSIS — D649 Anemia, unspecified: Secondary | ICD-10-CM

## 2023-06-05 DIAGNOSIS — K922 Gastrointestinal hemorrhage, unspecified: Secondary | ICD-10-CM

## 2023-06-05 MED ORDER — PEG 3350-KCL-NA BICARB-NACL 420 G PO SOLR: 4000.0000 mL | Freq: Once | ORAL | 0 refills | Status: AC

## 2023-06-07 LAB — CBC
Hematocrit: 34.9 % (ref 34.0–46.6)
Hemoglobin: 11 g/dL — ABNORMAL LOW (ref 11.1–15.9)
MCH: 30.6 pg (ref 26.6–33.0)
MCHC: 31.5 g/dL (ref 31.5–35.7)
MCV: 97 fL (ref 79–97)
Platelets: 346 10*3/uL (ref 150–450)
RBC: 3.6 x10E6/uL — ABNORMAL LOW (ref 3.77–5.28)
RDW: 14.1 % (ref 11.7–15.4)
WBC: 5 10*3/uL (ref 3.4–10.8)

## 2023-06-07 LAB — IRON,TIBC AND FERRITIN PANEL
Ferritin: 111 ng/mL (ref 15–150)
Iron Saturation: 12 % — ABNORMAL LOW (ref 15–55)
Iron: 43 ug/dL (ref 27–139)
Total Iron Binding Capacity: 373 ug/dL (ref 250–450)
UIBC: 330 ug/dL (ref 118–369)

## 2023-06-07 LAB — VITAMIN B12: Vitamin B-12: 815 pg/mL (ref 232–1245)

## 2023-06-11 ENCOUNTER — Telehealth: Payer: Self-pay

## 2023-06-11 ENCOUNTER — Telehealth: Payer: Self-pay | Admitting: Gastroenterology

## 2023-06-11 NOTE — Telephone Encounter (Signed)
Pt stated that [facility] keeps sending out reminders to refill her sucralfate she is not sure that she should get another refill requesting call back

## 2023-06-11 NOTE — Telephone Encounter (Signed)
Spoke with patient-she stated she is still taking the sulcrafate but not as much as prescribed- she does not want to get a refill at this time-she wants to see if the Omeprazole is helping with symptoms and if not she will go back on Sulcrafate-

## 2023-07-04 ENCOUNTER — Encounter: Payer: Self-pay | Admitting: Gastroenterology

## 2023-07-11 ENCOUNTER — Encounter
Admission: RE | Disposition: A | Discharge status: Home / Self Care | Payer: Self-pay | Source: Home / Self Care | Attending: Gastroenterology

## 2023-07-11 ENCOUNTER — Ambulatory Visit: Payer: Medicare HMO | Admitting: Anesthesiology

## 2023-07-11 ENCOUNTER — Ambulatory Visit
Admission: RE | Admit: 2023-07-11 | Discharge: 2023-07-11 | Disposition: A | Discharge status: Home / Self Care | Payer: Medicare HMO | Attending: Gastroenterology | Admitting: Gastroenterology

## 2023-07-11 DIAGNOSIS — K635 Polyp of colon: Secondary | ICD-10-CM

## 2023-07-11 DIAGNOSIS — Z1211 Encounter for screening for malignant neoplasm of colon: Secondary | ICD-10-CM | POA: Diagnosis present

## 2023-07-11 DIAGNOSIS — E785 Hyperlipidemia, unspecified: Secondary | ICD-10-CM | POA: Diagnosis not present

## 2023-07-11 DIAGNOSIS — E079 Disorder of thyroid, unspecified: Secondary | ICD-10-CM | POA: Insufficient documentation

## 2023-07-11 DIAGNOSIS — Z87891 Personal history of nicotine dependence: Secondary | ICD-10-CM | POA: Diagnosis not present

## 2023-07-11 DIAGNOSIS — Z8711 Personal history of peptic ulcer disease: Secondary | ICD-10-CM | POA: Insufficient documentation

## 2023-07-11 DIAGNOSIS — D123 Benign neoplasm of transverse colon: Secondary | ICD-10-CM | POA: Insufficient documentation

## 2023-07-11 HISTORY — PX: BIOPSY: SHX5522

## 2023-07-11 HISTORY — PX: COLONOSCOPY WITH PROPOFOL: SHX5780

## 2023-07-11 SURGERY — COLONOSCOPY WITH PROPOFOL
Anesthesia: General

## 2023-07-11 MED ORDER — SODIUM CHLORIDE 0.9 % IV SOLN
INTRAVENOUS | Status: DC
Start: 2023-07-11 — End: 2023-07-11
  Administered 2023-07-11: 20 mL/h via INTRAVENOUS

## 2023-07-11 MED ORDER — LIDOCAINE HCL (CARDIAC) PF 100 MG/5ML IV SOSY
PREFILLED_SYRINGE | INTRAVENOUS | Status: DC | PRN
  Administered 2023-07-11: 100 mg via INTRAVENOUS

## 2023-07-11 MED ORDER — PROPOFOL 500 MG/50ML IV EMUL
INTRAVENOUS | Status: DC | PRN
  Administered 2023-07-11: 150 ug/kg/min via INTRAVENOUS
  Administered 2023-07-11: 50 mg via INTRAVENOUS

## 2023-07-11 NOTE — Anesthesia Postprocedure Evaluation (Signed)
Anesthesia Post Note  Patient: Diane Weiss  Procedure(s) Performed: COLONOSCOPY WITH PROPOFOL BIOPSY  Patient location during evaluation: PACU Anesthesia Type: General Level of consciousness: awake and alert Pain management: pain level controlled Vital Signs Assessment: post-procedure vital signs reviewed and stable Respiratory status: spontaneous breathing, nonlabored ventilation and respiratory function stable Cardiovascular status: blood pressure returned to baseline and stable Postop Assessment: no apparent nausea or vomiting Anesthetic complications: no   There were no known notable events for this encounter.   Last Vitals:  Vitals:   07/11/23 0906 07/11/23 0913  BP: (!) 127/104   Pulse: (!) 58 (!) 56  Resp:    Temp:    SpO2:  99%    Last Pain:  Vitals:   07/11/23 0913  TempSrc:   PainSc: 0-No pain                 Foye Deer

## 2023-07-11 NOTE — H&P (Signed)
Arlyss Repress, MD [address]  [address]  [place], [place] [address]  Main: [phone]  Fax: [phone] Pager: [phone]  Primary Care Physician:  Jerl Mina, MD Primary Gastroenterologist:  Dr. Arlyss Repress  Pre-Procedure History & Physical: HPI:  Diane Weiss is a 72 y.o. female is here for an colonoscopy.   Past Medical History:  Diagnosis Date   Acute blood loss anemia    GI bleeding 01/04/2021   History of stomach ulcers    Hyperlipidemia    Symptomatic anemia    Thyroid disease    Vertigo    no episodes for several years   Wears dentures    partial lower    Past Surgical History:  Procedure Laterality Date   CARPAL TUNNEL RELEASE Right 07/07/2016   Procedure: OPEN CARPAL TUNNEL RELEASE;  Surgeon: Erin Sons, MD;  Location: [facility];  Service: Orthopedics;  Laterality: Right;   ESOPHAGOGASTRODUODENOSCOPY (EGD) WITH PROPOFOL N/A 01/05/2021   Procedure: ESOPHAGOGASTRODUODENOSCOPY (EGD) WITH PROPOFOL;  Surgeon: Toney Reil, MD;  Location: [facility] ENDOSCOPY;  Service: Gastroenterology;  Laterality: N/A;   ESOPHAGOGASTRODUODENOSCOPY (EGD) WITH PROPOFOL N/A 05/19/2023   Procedure: ESOPHAGOGASTRODUODENOSCOPY (EGD) WITH PROPOFOL;  Surgeon: Toney Reil, MD;  Location: [facility] ENDOSCOPY;  Service: Gastroenterology;  Laterality: N/A;   HEMOSTASIS CLIP PLACEMENT  05/19/2023   Procedure: HEMOSTASIS CLIP PLACEMENT;  Surgeon: Toney Reil, MD;  Location: [facility] ENDOSCOPY;  Service: Gastroenterology;;   KNEE SURGERY  1990   STOMACH SURGERY  1983   bleeding ulcers   SUBMUCOSAL INJECTION  05/19/2023   Procedure: SUBMUCOSAL INJECTION;  Surgeon: Toney Reil, MD;  Location: [facility] ENDOSCOPY;  Service: Gastroenterology;;    Prior to Admission medications   Medication Sig Start Date End Date Taking? Authorizing Provider  acetaminophen (TYLENOL) 325 MG tablet Take 650 mg by mouth daily as needed for mild pain or headache.   Yes [provider]  baclofen (LIORESAL) 10 MG tablet Take 10 mg by mouth daily.   Yes [provider]  Cholecalciferol (D3) 50 MCG (2000 UT) TABS Take 2,000 Units by mouth at bedtime.   Yes [provider]  cyanocobalamin 1000 MCG tablet Take 1 tablet (1,000 mcg total) by mouth daily. 05/22/23 07/21/23 Yes Sreenath, Sudheer B, MD  docusate sodium (COLACE) 100 MG capsule Take 100 mg by mouth 2 (two) times daily.   Yes [provider]  levothyroxine (SYNTHROID, LEVOTHROID) 75 MCG tablet Take 75 mcg by mouth daily before breakfast.   Yes [provider]  lovastatin (ALTOPREV) 40 MG 24 hr tablet Take 40 mg by mouth at bedtime.   Yes [provider]  Lutein 20 MG TABS Take 20 mg by mouth daily.   Yes [provider]  omeprazole (PRILOSEC) 40 MG capsule Take 1 capsule (40 mg total) by mouth 2 (two) times daily before a meal. 05/21/23 07/20/23 Yes Sreenath, Sudheer B, MD  traMADol (ULTRAM) 50 MG tablet Take 50 mg by mouth 2 (two) times daily. 11/16/18  Yes [provider]  sucralfate (CARAFATE) 1 g tablet Take 1 tablet (1 g total) by mouth 4 (four) times daily -  with meals and at bedtime. 05/15/23 06/14/23  Anders Simmonds T, DO    Allergies as of 06/05/2023 - Review Complete 06/05/2023  Allergen Reaction Noted   Aspirin Other (See Comments) 06/01/2022    Family History  Problem Relation Age of Onset   Hypertension Mother    Diabetes Mother    Hypertension Father  Cancer Cousin    Breast cancer Sister 54   Breast cancer Maternal Aunt 30   Breast cancer Paternal Aunt 11    Social History   Socioeconomic History   Marital status: Widowed    Spouse name: Not on file   Number of children: Not on file   Years of education: Not on file   Highest education level: Not on file  Occupational History   Not on file  Tobacco Use   Smoking status: Former    Current packs/day: 0.00    Types: Cigarettes    Quit date: 09/11/1981    Years since  quitting: 41.8   Smokeless tobacco: Never  Substance and Sexual Activity   Alcohol use: No   Drug use: No   Sexual activity: Not on file  Other Topics Concern   Not on file  Social History Narrative   Not on file   Social Determinants of Health   Financial Resource Strain: Not on file  Food Insecurity: Not on file  Transportation Needs: Not on file  Physical Activity: Not on file  Stress: Not on file  Social Connections: Not on file  Intimate Partner Violence: Not on file    Review of Systems: See HPI, otherwise negative ROS  Physical Exam: BP (!) 164/84   Pulse 72   Temp (!) 96.9 F (36.1 C) (Temporal)   Resp 20   Ht 5\' 5"  (1.651 m)   Wt 75.6 kg   SpO2 100%   BMI 27.72 kg/m  General:   Alert,  pleasant and cooperative in NAD Head:  Normocephalic and atraumatic. Neck:  Supple; no masses or thyromegaly. Lungs:  Clear throughout to auscultation.    Heart:  Regular rate and rhythm. Abdomen:  Soft, nontender and nondistended. Normal bowel sounds, without guarding, and without rebound.   Neurologic:  Alert and  oriented x4;  grossly normal neurologically.  Impression/Plan: Diane Weiss is here for an colonoscopy to be performed for colon cancer screening  Risks, benefits, limitations, and alternatives regarding  colonoscopy have been reviewed with the patient.  Questions have been answered.  All parties agreeable.   Lannette Donath, MD  07/11/2023, 7:53 AM

## 2023-07-11 NOTE — Op Note (Signed)
[facility] Patient Name: Diane Weiss Procedure Date: 07/11/2023 8:23 AM MRN: [id number] Account #: [id number] Date of Birth: [birth year] Admit Type: Outpatient Age: 72 Room: [address] [facility] ENDO ROOM 1 Gender: Female Note Status: Finalized Instrument Name: Peds Colonoscope [id number] Procedure:             Colonoscopy Indications:           Screening for colorectal malignant neoplasm, Last                         colonoscopy: April 2014 Providers:             Toney Reil MD, MD Referring MD:          Rhona Leavens. Burnett Sheng, MD (Referring MD) Medicines:             General Anesthesia Complications:         No immediate complications. Estimated blood loss: None. Procedure:             Pre-Anesthesia Assessment:                        - Prior to the procedure, a History and Physical was                         performed, and patient medications and allergies were                         reviewed. The patient is competent. The risks and                         benefits of the procedure and the sedation options and                         risks were discussed with the patient. All questions                         were answered and informed consent was obtained.                         Patient identification and proposed procedure were                         verified by the physician, the nurse, the                         anesthesiologist, the anesthetist and the technician                         in the pre-procedure area in the procedure room in the                         endoscopy suite. Mental Status Examination: alert and                         oriented. Airway Examination: normal oropharyngeal                         airway and neck mobility. Respiratory Examination:  clear to auscultation. CV Examination: normal.                         Prophylactic Antibiotics: The patient does not require                         prophylactic  antibiotics. Prior Anticoagulants: The                         patient has taken no anticoagulant or antiplatelet                         agents. ASA Grade Assessment: II - A patient with mild                         systemic disease. After reviewing the risks and                         benefits, the patient was deemed in satisfactory                         condition to undergo the procedure. The anesthesia                         plan was to use general anesthesia. Immediately prior                         to administration of medications, the patient was                         re-assessed for adequacy to receive sedatives. The                         heart rate, respiratory rate, oxygen saturations,                         blood pressure, adequacy of pulmonary ventilation, and                         response to care were monitored throughout the                         procedure. The physical status of the patient was                         re-assessed after the procedure.                        After obtaining informed consent, the colonoscope was                         passed under direct vision. Throughout the procedure,                         the patient's blood pressure, pulse, and oxygen                         saturations were monitored continuously. The  Colonoscope was introduced through the anus and                         advanced to the the terminal ileum, with                         identification of the appendiceal orifice and IC                         valve. The colonoscopy was performed without                         difficulty. The patient tolerated the procedure well.                         The quality of the bowel preparation was evaluated                         using the BBPS [facility] Bowel Preparation Scale) with                         scores of: Right Colon = 3, Transverse Colon = 3 and                         Left Colon = 3 (entire  mucosa seen well with no                         residual staining, small fragments of stool or opaque                         liquid). The total BBPS score equals 9. The ileocecal                         valve, appendiceal orifice, and rectum were                         photographed. Findings:      The perianal and digital rectal examinations were normal. Pertinent       negatives include normal sphincter tone and no palpable rectal lesions.      A 3 mm polyp was found in the transverse colon. The polyp was sessile.       The polyp was removed with a jumbo cold forceps. Resection and retrieval       were complete. Estimated blood loss: none.      The retroflexed view of the distal rectum and anal verge was normal and       showed no anal or rectal abnormalities. Impression:            - One 3 mm polyp in the transverse colon, removed with                         a jumbo cold forceps. Resected and retrieved.                        - The distal rectum and anal verge are normal on  retroflexion view. Recommendation:        - Discharge patient to home (with escort).                        - Resume previous diet today.                        - Continue present medications.                        - Await pathology results.                        - Repeat colonoscopy in 7-10 years for surveillance                         based on pathology results. Procedure Code(s):     --- Professional ---                        [phone], Colonoscopy, flexible; with biopsy, single or                         multiple Diagnosis Code(s):     --- Professional ---                        Z12.11, Encounter for screening for malignant neoplasm                         of colon                        D12.3, Benign neoplasm of transverse colon (hepatic                         flexure or splenic flexure) CPT copyright 2022 American Medical Association. All rights reserved. The codes documented in  this report are preliminary and upon coder review may  be revised to meet current compliance requirements. Dr. Libby Maw Toney Reil MD, MD 07/11/2023 8:52:09 AM This report has been signed electronically. Number of Addenda: 0 Note Initiated On: 07/11/2023 8:23 AM Scope Withdrawal Time: 0 hours 9 minutes 14 seconds  Total Procedure Duration: 0 hours 12 minutes 30 seconds  Estimated Blood Loss:  Estimated blood loss: none.      [facility]

## 2023-07-11 NOTE — Anesthesia Preprocedure Evaluation (Addendum)
Anesthesia Evaluation  Patient identified by MRN, date of birth, ID band Patient awake    Reviewed: Allergy & Precautions, NPO status , Patient's Chart, lab work & pertinent test results  History of Anesthesia Complications Negative for: history of anesthetic complications  Airway Mallampati: II  TM Distance: >3 FB Neck ROM: Full    Dental  (+) Partial Lower   Pulmonary neg sleep apnea, neg COPD, Patient abstained from smoking.Not current smoker, former smoker   Pulmonary exam normal breath sounds clear to auscultation       Cardiovascular Exercise Tolerance: Good METS(-) hypertension(-) CAD and (-) Past MI negative cardio ROS (-) dysrhythmias  Rhythm:Regular Rate:Normal - Systolic murmurs    Neuro/Psych negative neurological ROS  negative psych ROS   GI/Hepatic PUD,neg GERD  ,,(+)     (-) substance abuse  Hospitalized at [facility] 05/18/2023 until 05/21/2023 for upper GI bleed and acute blood loss anem   Endo/Other  neg diabetesHypothyroidism    Renal/GU negative Renal ROS     Musculoskeletal   Abdominal   Peds  Hematology  (+) Blood dyscrasia, anemia   Anesthesia Other Findings Past Medical History: No date: Acute blood loss anemia 01/04/2021: GI bleeding No date: History of stomach ulcers No date: Hyperlipidemia No date: Symptomatic anemia No date: Thyroid disease No date: Vertigo     Comment:  no episodes for several years No date: Wears dentures     Comment:  partial lower  Reproductive/Obstetrics                             Anesthesia Physical Anesthesia Plan  ASA: 2  Anesthesia Plan: General   Post-op Pain Management: Minimal or no pain anticipated   Induction: Intravenous  PONV Risk Score and Plan: 3 and TIVA and Propofol infusion  Airway Management Planned: Natural Airway  Additional Equipment: None  Intra-op Plan:   Post-operative Plan:   Informed Consent: I  have reviewed the patients History and Physical, chart, labs and discussed the procedure including the risks, benefits and alternatives for the proposed anesthesia with the patient or authorized representative who has indicated his/her understanding and acceptance.   Patient has DNR.  Discussed DNR with patient and Suspend DNR.     Plan Discussed with: CRNA and Surgeon  Anesthesia Plan Comments: (Discussed DNR. Patient wishes for [place] to perform all measures to keep her alive get her out of the OR, including chest compressions and defibrillation.)       Anesthesia Quick Evaluation

## 2023-07-11 NOTE — Transfer of Care (Signed)
Immediate Anesthesia Transfer of Care Note  Patient: Diane Weiss  Procedure(s) Performed: COLONOSCOPY WITH PROPOFOL BIOPSY  Patient Location: PACU  Anesthesia Type:General  Level of Consciousness: drowsy  Airway & Oxygen Therapy: Patient Spontanous Breathing  Post-op Assessment: Report given to RN and Post -op Vital signs reviewed and stable  Post vital signs: stable  Last Vitals:  Vitals Value Taken Time  BP    Temp    Pulse    Resp    SpO2      Last Pain:  Vitals:   07/11/23 0853  TempSrc: Temporal  PainSc: Asleep         Complications: No notable events documented.

## 2023-07-12 ENCOUNTER — Encounter: Payer: Self-pay | Admitting: Gastroenterology

## 2023-07-12 LAB — SURGICAL PATHOLOGY

## 2023-07-13 ENCOUNTER — Encounter: Payer: Self-pay | Admitting: Gastroenterology

## 2023-07-25 ENCOUNTER — Telehealth: Payer: Self-pay

## 2023-07-25 NOTE — Telephone Encounter (Signed)
Patient is calling because she states she saw Dr. Allegra Lai in the Hospital and she put her on Omeprazole 40mg  twice a day. She states she told her she would be on this long term. The hospital discharge her with 90 days 0 refills. She had a follow up appointment with Celso Amy on 06/05/2023 and was told to continue PPI. Do you want her to continue the omeprazole twice a day or go down to once  a day. Last EGD was 05/19/2023

## 2023-07-25 NOTE — Telephone Encounter (Signed)
Reviewed her EGD from 05/19/2023.  Recommend to decrease omeprazole to 40 mg once a day before meals indefinitely  RV

## 2023-07-26 MED ORDER — OMEPRAZOLE 40 MG PO CPDR: 40.0000 mg | DELAYED_RELEASE_CAPSULE | Freq: Every day | ORAL | 1 refills | Status: AC

## 2023-07-26 NOTE — Addendum Note (Signed)
Addended by: Radene Knee L on: 07/26/2023 08:14 AM   Modules accepted: Orders

## 2023-07-26 NOTE — Telephone Encounter (Signed)
Sent medication to the pharmacy and called and left a message for call back for patient

## 2023-07-26 NOTE — Telephone Encounter (Signed)
Patient verbalized understanding of instructions  

## 2023-11-06 ENCOUNTER — Other Ambulatory Visit: Payer: Self-pay | Admitting: Family Medicine

## 2023-11-06 DIAGNOSIS — Z1231 Encounter for screening mammogram for malignant neoplasm of breast: Secondary | ICD-10-CM

## 2024-01-14 ENCOUNTER — Ambulatory Visit
Admission: RE | Admit: 2024-01-14 | Discharge: 2024-01-14 | Disposition: A | Discharge status: Home / Self Care | Payer: Medicare HMO | Source: Ambulatory Visit | Attending: Family Medicine | Admitting: Family Medicine

## 2024-01-14 DIAGNOSIS — Z1231 Encounter for screening mammogram for malignant neoplasm of breast: Secondary | ICD-10-CM | POA: Insufficient documentation

## 2024-01-17 ENCOUNTER — Other Ambulatory Visit: Payer: Self-pay | Admitting: Family Medicine

## 2024-01-17 DIAGNOSIS — R928 Other abnormal and inconclusive findings on diagnostic imaging of breast: Secondary | ICD-10-CM

## 2024-01-18 ENCOUNTER — Ambulatory Visit
Admission: RE | Admit: 2024-01-18 | Discharge: 2024-01-18 | Disposition: A | Discharge status: Home / Self Care | Source: Ambulatory Visit | Attending: Family Medicine | Admitting: Family Medicine

## 2024-01-18 DIAGNOSIS — R928 Other abnormal and inconclusive findings on diagnostic imaging of breast: Secondary | ICD-10-CM | POA: Diagnosis present

## 2024-01-22 ENCOUNTER — Ambulatory Visit
Admission: RE | Admit: 2024-01-22 | Discharge: 2024-01-22 | Disposition: A | Discharge status: Home / Self Care | Source: Ambulatory Visit | Attending: Family Medicine | Admitting: Family Medicine

## 2024-01-22 ENCOUNTER — Other Ambulatory Visit: Payer: Self-pay | Admitting: Family Medicine

## 2024-01-22 DIAGNOSIS — R928 Other abnormal and inconclusive findings on diagnostic imaging of breast: Secondary | ICD-10-CM

## 2024-01-22 DIAGNOSIS — N6489 Other specified disorders of breast: Secondary | ICD-10-CM

## 2024-01-22 DIAGNOSIS — N6022 Fibroadenosis of left breast: Secondary | ICD-10-CM | POA: Diagnosis present

## 2024-01-22 HISTORY — PX: BREAST BIOPSY: SHX20

## 2024-01-22 MED ORDER — LIDOCAINE-EPINEPHRINE 1 %-1:100000 IJ SOLN
20.0000 mL | Freq: Once | INTRAMUSCULAR | Status: AC
  Administered 2024-01-22: 20 mL
  Filled 2024-01-22: qty 20

## 2024-01-22 MED ORDER — LIDOCAINE 1 % OPTIME INJ - NO CHARGE
5.0000 mL | Freq: Once | INTRAMUSCULAR | Status: AC
  Administered 2024-01-22: 5 mL
  Filled 2024-01-22: qty 6

## 2024-01-23 LAB — SURGICAL PATHOLOGY
